# Patient Record
Sex: Male | Born: 1945 | Race: White | Hispanic: No | Marital: Single | Smoking: Current every day smoker
Health system: Southern US, Community
[De-identification: ages and names within clinical notes are randomized; demographics above are authoritative.]

## PROBLEM LIST (undated history)

## (undated) DIAGNOSIS — C801 Malignant (primary) neoplasm, unspecified: Secondary | ICD-10-CM

## (undated) DIAGNOSIS — E119 Type 2 diabetes mellitus without complications: Secondary | ICD-10-CM

## (undated) DIAGNOSIS — I10 Essential (primary) hypertension: Secondary | ICD-10-CM

## (undated) DIAGNOSIS — E039 Hypothyroidism, unspecified: Secondary | ICD-10-CM

## (undated) DIAGNOSIS — R011 Cardiac murmur, unspecified: Secondary | ICD-10-CM

## (undated) DIAGNOSIS — M1990 Unspecified osteoarthritis, unspecified site: Secondary | ICD-10-CM

## (undated) HISTORY — PX: TOTAL THYROIDECTOMY: SHX2547

## (undated) HISTORY — PX: WISDOM TOOTH EXTRACTION: SHX21

## (undated) HISTORY — PX: EYE MUSCLE SURGERY: SHX370

---

## 2002-05-28 ENCOUNTER — Ambulatory Visit (HOSPITAL_COMMUNITY): Admission: RE | Admit: 2002-05-28 | Discharge: 2002-05-28 | Payer: Self-pay | Admitting: Gastroenterology

## 2002-05-28 ENCOUNTER — Encounter (INDEPENDENT_AMBULATORY_CARE_PROVIDER_SITE_OTHER): Payer: Self-pay | Admitting: *Deleted

## 2006-11-15 ENCOUNTER — Other Ambulatory Visit: Admission: RE | Admit: 2006-11-15 | Discharge: 2006-11-15 | Payer: Self-pay | Admitting: Endocrinology

## 2007-01-21 ENCOUNTER — Encounter (INDEPENDENT_AMBULATORY_CARE_PROVIDER_SITE_OTHER): Payer: Self-pay | Admitting: *Deleted

## 2007-01-21 ENCOUNTER — Other Ambulatory Visit: Admission: RE | Admit: 2007-01-21 | Discharge: 2007-01-21 | Payer: Self-pay | Admitting: Interventional Radiology

## 2007-01-21 ENCOUNTER — Encounter: Admission: RE | Admit: 2007-01-21 | Discharge: 2007-01-21 | Payer: Self-pay | Admitting: Surgery

## 2007-10-01 ENCOUNTER — Encounter (INDEPENDENT_AMBULATORY_CARE_PROVIDER_SITE_OTHER): Payer: Self-pay | Admitting: Surgery

## 2007-10-01 ENCOUNTER — Ambulatory Visit (HOSPITAL_COMMUNITY): Admission: RE | Admit: 2007-10-01 | Discharge: 2007-10-02 | Payer: Self-pay | Admitting: Surgery

## 2008-07-13 ENCOUNTER — Encounter: Payer: Self-pay | Admitting: Internal Medicine

## 2008-08-17 ENCOUNTER — Encounter: Payer: Self-pay | Admitting: Internal Medicine

## 2008-08-18 ENCOUNTER — Encounter: Payer: Self-pay | Admitting: Internal Medicine

## 2008-08-26 ENCOUNTER — Ambulatory Visit: Payer: Self-pay | Admitting: Internal Medicine

## 2008-08-26 DIAGNOSIS — Z85850 Personal history of malignant neoplasm of thyroid: Secondary | ICD-10-CM | POA: Insufficient documentation

## 2008-08-26 DIAGNOSIS — I10 Essential (primary) hypertension: Secondary | ICD-10-CM | POA: Insufficient documentation

## 2008-08-26 DIAGNOSIS — R7309 Other abnormal glucose: Secondary | ICD-10-CM

## 2008-08-30 LAB — CONVERTED CEMR LAB: Hgb A1c MFr Bld: 6.7 % — ABNORMAL HIGH (ref 4.6–6.0)

## 2008-09-02 ENCOUNTER — Telehealth (INDEPENDENT_AMBULATORY_CARE_PROVIDER_SITE_OTHER): Payer: Self-pay | Admitting: *Deleted

## 2008-09-03 ENCOUNTER — Ambulatory Visit: Payer: Self-pay | Admitting: Internal Medicine

## 2008-09-03 ENCOUNTER — Encounter (INDEPENDENT_AMBULATORY_CARE_PROVIDER_SITE_OTHER): Payer: Self-pay | Admitting: *Deleted

## 2008-09-03 LAB — CONVERTED CEMR LAB
OCCULT 1: NEGATIVE
OCCULT 2: NEGATIVE

## 2008-09-07 ENCOUNTER — Ambulatory Visit: Payer: Self-pay | Admitting: Internal Medicine

## 2008-09-09 ENCOUNTER — Encounter (INDEPENDENT_AMBULATORY_CARE_PROVIDER_SITE_OTHER): Payer: Self-pay | Admitting: *Deleted

## 2008-09-09 LAB — CONVERTED CEMR LAB: Microalb Creat Ratio: 1.6 mg/g (ref 0.0–30.0)

## 2008-10-26 ENCOUNTER — Ambulatory Visit: Payer: Self-pay | Admitting: Internal Medicine

## 2008-10-31 LAB — CONVERTED CEMR LAB
BUN: 20 mg/dL (ref 6–23)
Creatinine, Ser: 1.1 mg/dL (ref 0.4–1.5)
Creatinine,U: 177.2 mg/dL
Hgb A1c MFr Bld: 5.9 % (ref 4.6–6.0)

## 2008-11-02 ENCOUNTER — Ambulatory Visit: Payer: Self-pay | Admitting: Internal Medicine

## 2009-06-28 ENCOUNTER — Encounter: Payer: Self-pay | Admitting: Internal Medicine

## 2010-04-21 ENCOUNTER — Telehealth (INDEPENDENT_AMBULATORY_CARE_PROVIDER_SITE_OTHER): Payer: Self-pay | Admitting: *Deleted

## 2010-04-26 ENCOUNTER — Ambulatory Visit: Payer: Self-pay | Admitting: Internal Medicine

## 2010-04-26 DIAGNOSIS — F17200 Nicotine dependence, unspecified, uncomplicated: Secondary | ICD-10-CM | POA: Insufficient documentation

## 2010-04-26 DIAGNOSIS — Z860100 Personal history of colon polyps, unspecified: Secondary | ICD-10-CM | POA: Insufficient documentation

## 2010-04-26 DIAGNOSIS — F411 Generalized anxiety disorder: Secondary | ICD-10-CM | POA: Insufficient documentation

## 2010-12-18 LAB — CONVERTED CEMR LAB
ALT: 36 units/L (ref 0–53)
AST: 22 units/L (ref 0–37)
Albumin: 4.2 g/dL (ref 3.5–5.2)
Alkaline Phosphatase: 56 units/L (ref 39–117)
BUN: 17 mg/dL (ref 6–23)
Basophils Relative: 0.5 % (ref 0.0–3.0)
Chloride: 104 meq/L (ref 96–112)
Cholesterol: 289 mg/dL — ABNORMAL HIGH (ref 0–200)
Creatinine, Ser: 1.1 mg/dL (ref 0.4–1.5)
Eosinophils Relative: 1.4 % (ref 0.0–5.0)
GFR calc non Af Amer: 71.78 mL/min (ref >60)
HCT: 44.3 % (ref 39.0–52.0)
Hemoglobin: 15.5 g/dL (ref 13.0–17.0)
MCV: 89.2 fL (ref 78.0–100.0)
Neutrophils Relative %: 65.3 % (ref 43.0–77.0)
PSA: 2.82 ng/mL (ref 0.10–4.00)
Platelets: 265 10*3/uL (ref 150.0–400.0)
RBC: 4.97 M/uL (ref 4.22–5.81)
Sodium: 142 meq/L (ref 135–145)
TSH: 5.64 microintl units/mL — ABNORMAL HIGH (ref 0.35–5.50)
Total Bilirubin: 0.8 mg/dL (ref 0.3–1.2)

## 2010-12-20 NOTE — Progress Notes (Signed)
Summary: Refill Request(APPOINTMENT OVERDUE)  Phone Note Refill Request Call back at [phone] Message from:  Pharmacy on April 21, 2010 2:41 PM  Refills Requested: Medication #1:  ALPRAZOLAM 0.25MG  TABS, take (1) tablet twice a day as needed   Dosage confirmed as above?Dosage Confirmed   Supply Requested: 1 month [facility] Patient is not longer seeing Dr. Shelva Majestic, would Dr. Alwyn Ren please ok?  Next Appointment Scheduled: none Initial call taken by: Harold Barban,  April 21, 2010 2:42 PM  Follow-up for Phone Call        Last OV 2009, please call patient and scheduled OV then we will fill. Follow-up by: Shonna Chock,  April 21, 2010 4:53 PM  Additional Follow-up for Phone Call Additional follow up Details #1::        Patient is coming in 6.7.11. Additional Follow-up by: Harold Barban,  April 22, 2010 9:32 AM

## 2010-12-20 NOTE — Assessment & Plan Note (Signed)
Summary: roa per refill//lch   Vital Signs:  Patient profile:   65 year old male Weight:      256 pounds BMI:     35.83 Temp:     98.3 degrees F oral Pulse rate:   80 / minute Resp:     17 per minute BP sitting:   130 / 84  (left arm) Cuff size:   large  Vitals Entered By: Shonna Chock (April 26, 2010 9:09 AM) CC: follow-up visit, Hypertension Management Comments REVIEWED MED LIST, PATIENT AGREED DOSE AND INSTRUCTION CORRECT    CC:  follow-up visit and Hypertension Management.  History of Present Illness: Mr. Todd Roy had been Rxed Xanax by Dr Shelva Majestic for several years; but he is not practicing in the area now. It is taken for "nerves", < 4X/month. He uses it for anger management control. No F/U of HTN, thyroid status  or DM for > 1 year. FBS not monitored. See ROS & Disease Management for these diagnoses.  Hypertension History:      He denies headache, chest pain, palpitations, dyspnea with exertion, orthopnea, PND, peripheral edema, visual symptoms, neurologic problems, syncope, and side effects from treatment.  BP not monitored.        Positive major cardiovascular risk factors include male age 56 years old or older, hypertension, and current tobacco user.     Allergies: 1)  ! Sulfa  Past History:  Past Medical History: Hypertension Thyroid papillary cancer  ; Hyperglycemia,fasting Colonic polyps, hx of, Dr Kinnie Scales  Past Surgical History: Thyroidectomy for papillary cancer  09/2007, Dr  Darnell Level; thyroid biopsies X? 6 pre thyroidectomy * EYE SURGERY age 32 Colon polypectomy, Dr Kinnie Scales,  last 2010  Family History: Family History of Alcoholism/Addiction-MGF DM-M-Aunt Father: renal cancer, MI @ 13, HTN Mother:colon cancer  Siblings: none  Social History: Occupation: Veterinary surgeon Current Smoker: 4-5 cigars/ week Alcohol use-yes Regular exercise-no  Review of Systems General:  Denies fatigue and sleep disorder. Eyes:  Denies blurring, double vision, and vision  loss-both eyes. ENT:  Denies difficulty swallowing and hoarseness. CV:  Denies leg cramps with exertion. Resp:  Denies excessive snoring, hypersomnolence, and morning headaches. GI:  Denies constipation and diarrhea. GU:  Denies discharge, dysuria, and hematuria. Derm:  Complains of rash; denies changes in nail beds, dryness, hair loss, and poor wound healing; He sees Dr Londell Moh for scalp rash. Neuro:  Denies numbness and tingling. Psych:  Complains of anxiety and irritability; denies depression, easily angered, and easily tearful. Endo:  Denies cold intolerance, excessive hunger, excessive thirst, excessive urination, and heat intolerance.  Physical Exam  General:  well-nourished; alert,appropriate and cooperative throughout examination Eyes:  No corneal or conjunctival inflammation noted. EOMI. Perrla but small. No lid lag.  Neck:  No deformities, masses, or tenderness noted. Thyroid absent Lungs:  Normal respiratory effort, chest expands symmetrically. Lungs are clear to auscultation, no crackles or wheezes. Heart:  Normal rate and regular rhythm. S1 and S2 normal without gallop,  click, rub . G 1/2 systolic murmur @ apex   Impression & Recommendations:  Problem # 1:  HYPERTENSION (ICD-401.9)  controlled His updated medication list for this problem includes:    Losartan Potassium-hctz 100-25 Mg Tabs (Losartan potassium-hctz) .Marland Kitchen... 1 by mouth once daily  Orders: Venipuncture (78469) TLB-Lipid Panel (80061-LIPID) TLB-BMP (Basic Metabolic Panel-BMET) (80048-METABOL) EKG w/ Interpretation (93000)  Problem # 2:  HYPERGLYCEMIA, FASTING (ICD-790.29)  Orders: Venipuncture (62952) TLB-A1C / Hgb A1C (Glycohemoglobin) (83036-A1C)  Problem # 3:  NEOPLASM, MALIGNANT, THYROID  GLAND, HX OF (ICD-V10.87)  Orders: Venipuncture (04540) TLB-TSH (Thyroid Stimulating Hormone) (84443-TSH)  Problem # 4:  COLONIC POLYPS, HX OF (ICD-V12.72)  as per Dr Kinnie Scales  Orders: Venipuncture  (98119) TLB-CBC Platelet - w/Differential (85025-CBCD) TLB-Hepatic/Liver Function Pnl (80076-HEPATIC)  Problem # 5:  ANXIETY STATE, UNSPECIFIED (ICD-300.00)  His updated medication list for this problem includes:    Alprazolam 0.25 Mg Tabs (Alprazolam) .Marland Kitchen... 1 by mouth as needed  Problem # 6:  SPECIAL SCREENING MALIGNANT NEOPLASM OF PROSTATE (ICD-V76.44)  Orders: Venipuncture (14782) TLB-PSA (Prostate Specific Antigen) (84153-PSA)  Problem # 7:  SMOKER (ICD-305.1) risks discussed  Complete Medication List: 1)  Synthroid 175 Mcg Tabs (Levothyroxine sodium) .Marland Kitchen.. 1 by mouth once daily 2)  Losartan Potassium-hctz 100-25 Mg Tabs (Losartan potassium-hctz) .Marland Kitchen.. 1 by mouth once daily 3)  Freestyle Lite Test Strp (Glucose blood) .... As directed 4)  Freestyle Unistick Ii Lancets Misc (Lancets) .... As directed 5)  Alprazolam 0.25 Mg Tabs (Alprazolam) .Marland Kitchen.. 1 by mouth as needed 6)  Doxycycline Hyclate 50 Mg Caps (Doxycycline hyclate) .... 3 caps two times a day  Hypertension Assessment/Plan:      The patient's hypertensive risk group is category B: At least one risk factor (excluding diabetes) with no target organ damage.  Today's blood pressure is 130/84.    Patient Instructions: 1)  Stop Smoking Tips: Choose a Quit date. Cut down before the Quit date. decide what you will do as a substitute when you feel the urge to smoke(gum,toothpick,exercise).It is important that you exercise regularly at least 20 minutes 5 times a week. If you develop chest pain, have severe difficulty breathing, or feel very tired , stop exercising immediately and seek medical attention.Take an  81 mg coated  Aspirin every day. 2)  Check your Blood Pressure regularly. If it is above: you should make an appointment. Prescriptions: ALPRAZOLAM 0.25 MG TABS (ALPRAZOLAM) 1 by mouth as needed  #30 x 5   Entered and Authorized by:   Marga Melnick MD   Signed by:   Marga Melnick MD on 04/26/2010   Method used:   Print then  Give to Patient   RxID:   [id number] LOSARTAN POTASSIUM-HCTZ 100-25 MG TABS (LOSARTAN POTASSIUM-HCTZ) 1 by mouth once daily  #90 x 3   Entered and Authorized by:   Marga Melnick MD   Signed by:   Marga Melnick MD on 04/26/2010   Method used:   Print then Give to Patient   RxID:   [id number] SYNTHROID 175 MCG TABS (LEVOTHYROXINE SODIUM) 1 by mouth once daily  #90 x 3   Entered and Authorized by:   Marga Melnick MD   Signed by:   Marga Melnick MD on 04/26/2010   Method used:   Print then Give to Patient   RxID:   [id number]

## 2010-12-29 ENCOUNTER — Emergency Department (HOSPITAL_COMMUNITY)
Admission: EM | Admit: 2010-12-29 | Discharge: 2010-12-29 | Disposition: A | Discharge status: Home / Self Care | Payer: BC Managed Care – PPO | Attending: Emergency Medicine | Admitting: Emergency Medicine

## 2010-12-29 ENCOUNTER — Emergency Department (HOSPITAL_COMMUNITY): Payer: BC Managed Care – PPO

## 2010-12-29 DIAGNOSIS — I10 Essential (primary) hypertension: Secondary | ICD-10-CM | POA: Insufficient documentation

## 2010-12-29 DIAGNOSIS — B9789 Other viral agents as the cause of diseases classified elsewhere: Secondary | ICD-10-CM | POA: Insufficient documentation

## 2010-12-29 DIAGNOSIS — R05 Cough: Secondary | ICD-10-CM | POA: Insufficient documentation

## 2010-12-29 DIAGNOSIS — E039 Hypothyroidism, unspecified: Secondary | ICD-10-CM | POA: Insufficient documentation

## 2010-12-29 DIAGNOSIS — R509 Fever, unspecified: Secondary | ICD-10-CM | POA: Insufficient documentation

## 2010-12-29 DIAGNOSIS — R059 Cough, unspecified: Secondary | ICD-10-CM | POA: Insufficient documentation

## 2010-12-29 DIAGNOSIS — R000 Tachycardia, unspecified: Secondary | ICD-10-CM | POA: Insufficient documentation

## 2010-12-29 DIAGNOSIS — IMO0001 Reserved for inherently not codable concepts without codable children: Secondary | ICD-10-CM | POA: Insufficient documentation

## 2010-12-29 LAB — POCT I-STAT, CHEM 8
Calcium, Ion: 1.09 mmol/L — ABNORMAL LOW (ref 1.12–1.32)
Creatinine, Ser: 1.5 mg/dL (ref 0.4–1.5)
Glucose, Bld: 144 mg/dL — ABNORMAL HIGH (ref 70–99)
HCT: 44 % (ref 39.0–52.0)
TCO2: 23 mmol/L (ref 0–100)

## 2010-12-29 LAB — CBC
HCT: 41.1 % (ref 39.0–52.0)
Hemoglobin: 14.9 g/dL (ref 13.0–17.0)
RBC: 4.84 MIL/uL (ref 4.22–5.81)
RDW: 13.3 % (ref 11.5–15.5)

## 2010-12-29 LAB — DIFFERENTIAL
Eosinophils Absolute: 0 10*3/uL (ref 0.0–0.7)
Eosinophils Relative: 0 % (ref 0–5)
Lymphocytes Relative: 11 % — ABNORMAL LOW (ref 12–46)
Lymphs Abs: 1 10*3/uL (ref 0.7–4.0)
Monocytes Absolute: 0.9 10*3/uL (ref 0.1–1.0)
Monocytes Relative: 10 % (ref 3–12)

## 2010-12-29 LAB — URINALYSIS, ROUTINE W REFLEX MICROSCOPIC
Bilirubin Urine: NEGATIVE
Hgb urine dipstick: NEGATIVE
Specific Gravity, Urine: 1.024 (ref 1.005–1.030)
Urobilinogen, UA: 0.2 mg/dL (ref 0.0–1.0)
pH: 5.5 (ref 5.0–8.0)

## 2011-01-03 ENCOUNTER — Encounter: Payer: Self-pay | Admitting: Internal Medicine

## 2011-01-10 ENCOUNTER — Encounter: Payer: Self-pay | Admitting: Internal Medicine

## 2011-01-26 NOTE — Letter (Signed)
Summary: [facility]  [facility]   Imported By: Maryln Gottron 01/17/2011 12:47:25  _____________________________________________________________________  External Attachment:    Type:   Image     Comment:   External Document

## 2011-01-31 NOTE — Letter (Signed)
Summary: [facility]  [facility]   Imported By: Maryln Gottron 01/24/2011 15:08:32  _____________________________________________________________________  External Attachment:    Type:   Image     Comment:   External Document

## 2011-04-04 NOTE — Op Note (Signed)
NAMEATARI, NOVICK NO.:  [id number]   MEDICAL RECORD NO.:  [id number]          PATIENT TYPE:  AMB   LOCATION:  DAY                          FACILITY:  [facility]   PHYSICIAN:  Velora Heckler, MD      DATE OF BIRTH:  Nov 22, 1945   DATE OF PROCEDURE:  10/01/2007  DATE OF DISCHARGE:                               OPERATIVE REPORT   PREOPERATIVE DIAGNOSIS:  Bilateral thyroid nodules with cytologic  atypia.   POSTOPERATIVE DIAGNOSIS:  Same.   PROCEDURE:  Total thyroidectomy.   SURGEON:  Velora Heckler, MD, FACS   ASSISTANT:  Anselm Pancoast. Zachery Dakins, MD, FACS   ANESTHESIA:  General per Dr. Valli Glance. Denenny   ESTIMATED BLOOD LOSS:  Minimal.   PREPARATION:  Betadine.   COMPLICATIONS:  None.   INDICATIONS:  The patient is a 65 year old white male from [place],  [place].  Incidental finding of thyroid nodules was made by MRI  scan in 2007.  Subsequent thyroid ultrasound was performed showing  bilateral thyroid nodules.  However, the left-sided thyroid nodule  contained punctate calcifications.  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  The patient now comes to surgery for a total thyroidectomy  for bilateral thyroid nodules with cytologic atypia.   BODY OF REPORT:  Procedure was done in OR #6 at the [facility].  The patient was brought to the operating room,  placed in a supine position on the operating room table.  Following  administration of general anesthesia, the patient was prepped and draped  in usual strict aseptic fashion.  After ascertaining that an adequate  level of anesthesia had been obtained, a Kocher incision was made with a  #15 blade.  Dissection was carried through subcutaneous tissues.  Platysma was divided with the electrocautery.  Subplatysmal flaps were  elevated  cephalad and caudad from the thyroid notch to the sternal  notch.  A Mahorner self-retaining retractor was placed for exposure.  Strap muscles were incised in the midline.  Dissection was carried down  to the thyroid isthmus.  Strap muscles were reflected initially to the  left.  Left thyroid lobe was exposed.  There was a dominant mass in the  superior pole on the left side which measured approximately 2 to 2.5 cm  in size.  It was quite firm.  It was likely calcified.  Venous  tributaries were divided between medium hemoclips with the Harmonic  scalpel.  Inferior venous tributaries were divided with the Harmonic  scalpel.  Superior pole vessels were dissected out, ligated in  continuity with 2-0 silk ties and medium Ligaclips, and divided with the  Harmonic scalpel.  Gland was rolled anteriorly.  Parathyroid tissue was  identified and preserved.  Dissection was carried down to the ligament  of Berry.  Branches of the inferior thyroid artery were divided between  small Ligaclips with the Harmonic scalpel.  Ligament of Allyson Sabal was  transected with the electrocautery, and the gland was rolled up  and onto  the anterior trachea.  The isthmus was mobilized across the midline with  the electrocautery.   Next we turned our attention to the right thyroid lobe.  Right thyroid  lobe was approximately two to three times larger than the left.  However, it was soft without dominant or discrete mass.  There were  multiple small soft nodules in the right lobe.  Middle vein was divided  between medium Ligaclips with the Harmonic scalpel.  Superior pole  vessels were dissected out, ligated with medium hemoclips, and divided  with the Harmonic scalpel.  Gland was rolled anteriorly.  Parathyroid  tissue was identified and preserved.  Branches of the inferior thyroid  artery were divided between small and medium Ligaclips with the Harmonic  scalpel.  Gland was rolled further anteriorly.  Ligament of Allyson Sabal  was  transected with the electrocautery.  Gland was then excised off the  anterior trachea using the Harmonic scalpel.  A suture was used to mark  the left superior pole.  The entire gland was submitted to Pathology for  review.  The neck was irrigated copiously with warm saline.  Very good  hemostasis was noted bilaterally.  Surgicel was placed in the operative  field bilaterally.  Strap muscles were reapproximated in the midline  with interrupted 3-0 Vicryl sutures.  Platysma was closed with  interrupted 3-0 Vicryl sutures.  Skin was closed with a running 4-0  Monocryl subcuticular suture.  Wound was washed and dried, and Benzoin  and Steri-Strips were applied.  Sterile dressings were applied.  The  patient was awakened from anesthesia and brought to the recovery room in  stable condition.  The patient tolerated the procedure well.      Velora Heckler, MD  Electronically Signed     TMG/MEDQ  D:  10/01/2007  T:  10/02/2007  Job:  [id number]   cc:   Macarthur Critchley. Shelva Majestic, M.D.  Fax: [phone]   Titus Dubin. Alwyn Ren, MD,FACP,FCCP  [phone] [place]  [place] [address]   [facility] [place]. Venetia Maxon, M.D.  Fax: [phone]

## 2011-04-07 NOTE — Procedures (Signed)
[place]  Patient:    MARCELLO, TUZZOLINO Visit Number: [id number] MRN: [id number]          Service Type: END Location: ENDO Attending Physician:  Charna Elizabeth Dictated by:   Anselmo Rod, M.D. Proc. Date: 05/28/02 Admit Date:  05/28/2002 Discharge Date: 05/28/2002   CC:         Louanna Raw, M.D.   Procedure Report  DATE OF BIRTH:  1946-03-07.  PROCEDURE:  Colonoscopy with snare polypectomy x2.  ENDOSCOPIST:  Anselmo Rod, M.D.  INSTRUMENT USED:  Olympus video colonoscope.  INDICATION FOR PROCEDURE:  A 65 year old white male with a family history of colon cancer in his mother diagnosed in her 59s and a personal history of rectal bleeding.  Rule out colonic polyps, masses, hemorrhoids, etc.  PREPROCEDURE PREPARATION:  Informed consent was procured from the patient. The patient was fasted for eight hours prior to the procedure and prepped with a bottle of magnesium citrate and a gallon of NuLytely the night prior to the procedure.  PREPROCEDURE PHYSICAL:  VITAL SIGNS:  The patient had stable vital signs.  NECK:  Supple.  CHEST:  Clear to auscultation.  S1, S2 regular.  ABDOMEN:  Soft with normal bowel sounds.  DESCRIPTION OF PROCEDURE:  The patient was placed in the left lateral decubitus position and sedated with 100 mg of Demerol and 8.5 mg of Versed intravenously.  Once the patient was adequately sedate and maintained on low-flow oxygen and continuous cardiac monitoring, the Olympus video colonoscope was advanced from the rectum to the cecum with slight difficulty. The patient had significant amount of residual stool in the colon.  Multiple washes were done.  The procedure was complete up to the cecum.  The appendiceal orifice and the ileocecal valve were clearly visualized and photographed.  Very small lesions could have been missed secondary to an inappropriate prep.  A small sessile polyp was snared from 70 cm.   Another pedunculated polyp was snared from 20 cm.  There were small internal hemorrhoids seen on retroflexion.  The patient tolerated the procedure well without complication.  IMPRESSION: 1. Small, nonbleeding internal hemorrhoids. 2. Two polyps snared from the left colon, one at 20 cm and one at 70 cm, see    description above. 3. Large amount of residual stool in the colon.  Very small lesions could have    been missed.  RECOMMENDATIONS: 1. Await pathology results. 2. Avoid all nonsteroidals, including aspirin, for the next four weeks. 3. Outpatient follow-up in the next 10 days for further recommendations. Dictated by:   Anselmo Rod, M.D. Attending Physician:  Charna Elizabeth DD:  05/28/02 TD:  06/01/02 Job: [id number]

## 2011-05-20 ENCOUNTER — Other Ambulatory Visit: Payer: Self-pay | Admitting: Internal Medicine

## 2011-05-22 NOTE — Telephone Encounter (Signed)
(   Fasting lipids, hepatic panel, A1c, TSH in 10 weeks; 272.4,250.00,995.20, 244.9)  Copied from 04/2010. Labs need to be scheduled NOW and CPX needs to be scheduled for Next avaliable

## 2011-06-19 ENCOUNTER — Other Ambulatory Visit (INDEPENDENT_AMBULATORY_CARE_PROVIDER_SITE_OTHER): Payer: BC Managed Care – PPO

## 2011-06-19 DIAGNOSIS — E039 Hypothyroidism, unspecified: Secondary | ICD-10-CM

## 2011-06-19 DIAGNOSIS — E785 Hyperlipidemia, unspecified: Secondary | ICD-10-CM

## 2011-06-19 DIAGNOSIS — T887XXA Unspecified adverse effect of drug or medicament, initial encounter: Secondary | ICD-10-CM

## 2011-06-19 DIAGNOSIS — E119 Type 2 diabetes mellitus without complications: Secondary | ICD-10-CM

## 2011-06-19 LAB — HEPATIC FUNCTION PANEL
AST: 28 U/L (ref 0–37)
Albumin: 4.3 g/dL (ref 3.5–5.2)
Alkaline Phosphatase: 54 U/L (ref 39–117)
Bilirubin, Direct: 0.1 mg/dL (ref 0.0–0.3)

## 2011-06-19 LAB — HEMOGLOBIN A1C: Hgb A1c MFr Bld: 7.1 % — ABNORMAL HIGH (ref 4.6–6.5)

## 2011-06-19 LAB — LIPID PANEL: HDL: 50.1 mg/dL (ref >39.00)

## 2011-06-19 NOTE — Progress Notes (Signed)
Labs only

## 2011-06-29 ENCOUNTER — Other Ambulatory Visit: Payer: Self-pay | Admitting: Internal Medicine

## 2011-06-29 MED ORDER — SYNTHROID 175 MCG PO TABS: 175.0000 ug | ORAL_TABLET | ORAL | Status: DC

## 2011-06-29 MED ORDER — LOSARTAN POTASSIUM-HCTZ 100-25 MG PO TABS: 1.0000 | ORAL_TABLET | Freq: Every day | ORAL | Status: DC

## 2011-06-29 NOTE — Telephone Encounter (Signed)
Patient needs to schedule CPX and Labs: Lipid/Hep/TSH/PSA/CBCD/BMP/Stool Cards/Udip V70.0/401.9/272.4/994.20/

## 2011-07-26 ENCOUNTER — Other Ambulatory Visit: Payer: Self-pay | Admitting: Internal Medicine

## 2011-07-26 MED ORDER — SYNTHROID 175 MCG PO TABS: 175.0000 ug | ORAL_TABLET | ORAL | Status: DC

## 2011-07-26 MED ORDER — LOSARTAN POTASSIUM-HCTZ 100-25 MG PO TABS: 1.0000 | ORAL_TABLET | Freq: Every day | ORAL | Status: DC

## 2011-07-26 NOTE — Telephone Encounter (Signed)
RXs sent in 

## 2011-07-28 ENCOUNTER — Ambulatory Visit (INDEPENDENT_AMBULATORY_CARE_PROVIDER_SITE_OTHER): Payer: BC Managed Care – PPO | Admitting: Internal Medicine

## 2011-07-28 ENCOUNTER — Encounter: Payer: Self-pay | Admitting: Internal Medicine

## 2011-07-28 DIAGNOSIS — R7309 Other abnormal glucose: Secondary | ICD-10-CM

## 2011-07-28 DIAGNOSIS — Z85850 Personal history of malignant neoplasm of thyroid: Secondary | ICD-10-CM

## 2011-07-28 DIAGNOSIS — Z86010 Personal history of colonic polyps: Secondary | ICD-10-CM

## 2011-07-28 DIAGNOSIS — E785 Hyperlipidemia, unspecified: Secondary | ICD-10-CM

## 2011-07-28 DIAGNOSIS — I10 Essential (primary) hypertension: Secondary | ICD-10-CM

## 2011-07-28 MED ORDER — METFORMIN HCL ER 500 MG PO TB24: 500.0000 mg | ORAL_TABLET | Freq: Every day | ORAL | Status: DC

## 2011-07-28 MED ORDER — SYNTHROID 175 MCG PO TABS: 175.0000 ug | ORAL_TABLET | ORAL | Status: DC

## 2011-07-28 MED ORDER — PRAVASTATIN SODIUM 20 MG PO TABS: 20.0000 mg | ORAL_TABLET | Freq: Every evening | ORAL | Status: DC

## 2011-07-28 NOTE — Progress Notes (Signed)
  Subjective:    Patient ID: Todd Roy, male    DOB: 27-Jun-1946, 65 y.o.   MRN: [id number]  HPI Thyroid function monitor : TSH 0.89 Medications status(change in dose/brand/mode of administration): no Constitutional: Weight change: dropping with diet changes; Fatigue:no; Sleep pattern:chronic; Appetite:good  Visual change(blurred/diplopia/visual loss):no; last exam 4 mos ago Hoarseness:no; Swallowing issues:no Cardiovascular: Palpitations:no; Racing:no; Irregularity:no GI: Constipation:no; Diarrhea:no Derm: Change in nails/hair/skin:no Neuro: Numbness/tingling:no; Tremor:occasionally RUE as intention tremor Psych: Anxiety:no; Depression:no; Panic attacks:no Endo: Temperature intolerance: Heat:no; Cold:no   HYPERTENSION: Disease Monitoring: Blood pressure range-stable, normal  Dyspnea- no Medications: Compliance- yes  Lightheadedness,Syncope- no    Edema- no  DIABETES: A1c 7.1% Disease Monitoring: Blood Sugar ranges-FBS ?  Polyuria/phagia/dipsia- no       Medications: Compliance- not on DM meds  Hypoglycemic symptoms- no   HYPERLIPIDEMIA: TG 176, LDL 181.8 Disease Monitoring: See symptoms for Hypertension Medications: Compliance- not on meds     ROS See HPI above   PMH Smoking Status noted : 2-3 cigarettes / weekend     Review of Systems     Objective:   Physical Exam Gen.: Healthy and well-nourished in appearance. Alert, appropriate and cooperative throughout exam.  Eyes: No corneal or conjunctival inflammation noted.  Extraocular motion intact.   Neck: No deformities, masses, or tenderness noted.  Thyroid absent. Lungs: Normal respiratory effort; chest expands symmetrically. Lungs are clear to auscultation without rales, wheezes, or increased work of breathing. Heart: Normal rate and rhythm. Normal S1 and S2. No gallop, click, or rub. S4 w/o  murmur.                                                                           Musculoskeletal/extremities:  No clubbing, cyanosis, edema, or deformity noted. Joints normal. Nail health  good. Vascular: Carotid, radial artery, dorsalis pedis and  posterior tibial pulses are full and equal. No bruits present. Neurologic: Alert and oriented x3. Deep tendon reflexes symmetrical and normal.   Light touch normal over feet       Skin: Intact without suspicious lesions or rashes. Lymph: No cervical, axillary  lymphadenopathy present. Psych: Mood and affect are normal. Normally interactive                                                                                         Assessment & Plan:  #1 hypothyroidism, status post total thyroidectomy for malignancy. Optimal TSH would be at least lower limits of normal.  #2 diabetes, poor control. 42% increased long-term cardiovascular risk  #3 hypertension, excellent control  #4 dyslipidemia; LDL values associated with an 80% increased long-term cardiovascular risk  Plan see orders and recommendations.

## 2011-07-28 NOTE — Patient Instructions (Signed)
Eat a low-fat diet with lots of fruits and vegetables, up to 7-9 servings per day. Avoid obesity; your goal is waist measurement < 40 inches.Consume less than 40 grams of sugar per day from foods & drinks with High Fructose Corn Sugar as #1,2,3 or # 4 on label. Follow the low carb nutrition program in [employer] as closely as possible to prevent Diabetes progression & complications. White carbohydrates (potatoes, rice, bread, and pasta) have a high spike of sugar and a high load of sugar. For example a  baked potato has a cup of sugar and a  french fry  2 teaspoons of sugar. Yams, wild  rice, whole grained bread &  wheat pasta have been much lower spike and load of  sugar. Portions should be the size of a deck of cards or your palm. A1c NORMAL VALUES:  Non diabetic adults: 5 %-6.1%  Good diabetic control: 6.2-6.4 %  Fair diabetic control: 6.5-7%  Poor diabetic control: greater than 7 % ( except with additional factors such as  advanced age; significant coronary or neurologic disease,etc). Check the A1c every 6 months if it is < 6.5%; every 4 months if  6.5% or higher. Goals for home glucose monitoring are : fasting  or morning glucose goal of  90-150. Two hours after any meal , goal = < 180, preferably < 150. Please  schedule fasting Labs in 10 weeks : BMET,Lipids, hepatic panel,  TSH ( 250.02, 272.4)

## 2011-08-29 LAB — DIFFERENTIAL
Basophils Absolute: 0
Basophils Relative: 1
Monocytes Absolute: 0.4
Neutro Abs: 3.9
Neutrophils Relative %: 63

## 2011-08-29 LAB — BASIC METABOLIC PANEL
BUN: 13
Calcium: 9
Chloride: 101
Creatinine, Ser: 1.09

## 2011-08-29 LAB — CBC
HCT: 42.3
Hemoglobin: 15.2
MCHC: 36
MCV: 84.5
Platelets: 265
RBC: 5.01
RDW: 13.6
WBC: 6.1

## 2011-08-29 LAB — PROTIME-INR: INR: 1

## 2011-08-29 LAB — CALCIUM: Calcium: 8.8

## 2011-09-02 ENCOUNTER — Other Ambulatory Visit: Payer: Self-pay | Admitting: Internal Medicine

## 2011-09-26 ENCOUNTER — Other Ambulatory Visit: Payer: Self-pay | Admitting: Dermatology

## 2011-10-06 ENCOUNTER — Other Ambulatory Visit (HOSPITAL_COMMUNITY): Payer: Self-pay | Admitting: Internal Medicine

## 2011-10-06 ENCOUNTER — Other Ambulatory Visit: Payer: BC Managed Care – PPO

## 2011-10-06 DIAGNOSIS — I428 Other cardiomyopathies: Secondary | ICD-10-CM

## 2011-10-10 ENCOUNTER — Telehealth: Payer: Self-pay

## 2011-10-10 NOTE — Telephone Encounter (Signed)
Opened in error

## 2011-10-13 ENCOUNTER — Ambulatory Visit (HOSPITAL_COMMUNITY): Payer: BC Managed Care – PPO | Attending: Cardiology | Admitting: Radiology

## 2011-10-13 DIAGNOSIS — I359 Nonrheumatic aortic valve disorder, unspecified: Secondary | ICD-10-CM | POA: Insufficient documentation

## 2011-10-13 DIAGNOSIS — I428 Other cardiomyopathies: Secondary | ICD-10-CM | POA: Insufficient documentation

## 2011-10-13 DIAGNOSIS — E119 Type 2 diabetes mellitus without complications: Secondary | ICD-10-CM | POA: Insufficient documentation

## 2011-10-13 DIAGNOSIS — I059 Rheumatic mitral valve disease, unspecified: Secondary | ICD-10-CM | POA: Insufficient documentation

## 2011-10-13 DIAGNOSIS — I379 Nonrheumatic pulmonary valve disorder, unspecified: Secondary | ICD-10-CM | POA: Insufficient documentation

## 2011-10-13 DIAGNOSIS — I10 Essential (primary) hypertension: Secondary | ICD-10-CM | POA: Insufficient documentation

## 2011-10-13 DIAGNOSIS — E785 Hyperlipidemia, unspecified: Secondary | ICD-10-CM | POA: Insufficient documentation

## 2012-02-19 ENCOUNTER — Other Ambulatory Visit: Payer: Self-pay | Admitting: Internal Medicine

## 2012-02-19 NOTE — Telephone Encounter (Signed)
Refill done.  

## 2012-03-22 ENCOUNTER — Other Ambulatory Visit: Payer: Self-pay | Admitting: Internal Medicine

## 2012-03-26 ENCOUNTER — Other Ambulatory Visit: Payer: Self-pay | Admitting: Internal Medicine

## 2012-03-26 NOTE — Telephone Encounter (Signed)
OK X1 

## 2012-03-26 NOTE — Telephone Encounter (Signed)
RX sent

## 2012-03-26 NOTE — Telephone Encounter (Signed)
Dr.Hopper please advise, last filled 2011. Last OV 07/2011

## 2012-06-08 IMAGING — CR DG CHEST 2V
2 series · 2 of 2 positions shown · non-contrast
Comparison: 10/01/2007.

CLINICAL DATA: Fever, cough, headache and congestion.

CHEST - 2 VIEW

[w chest pa]
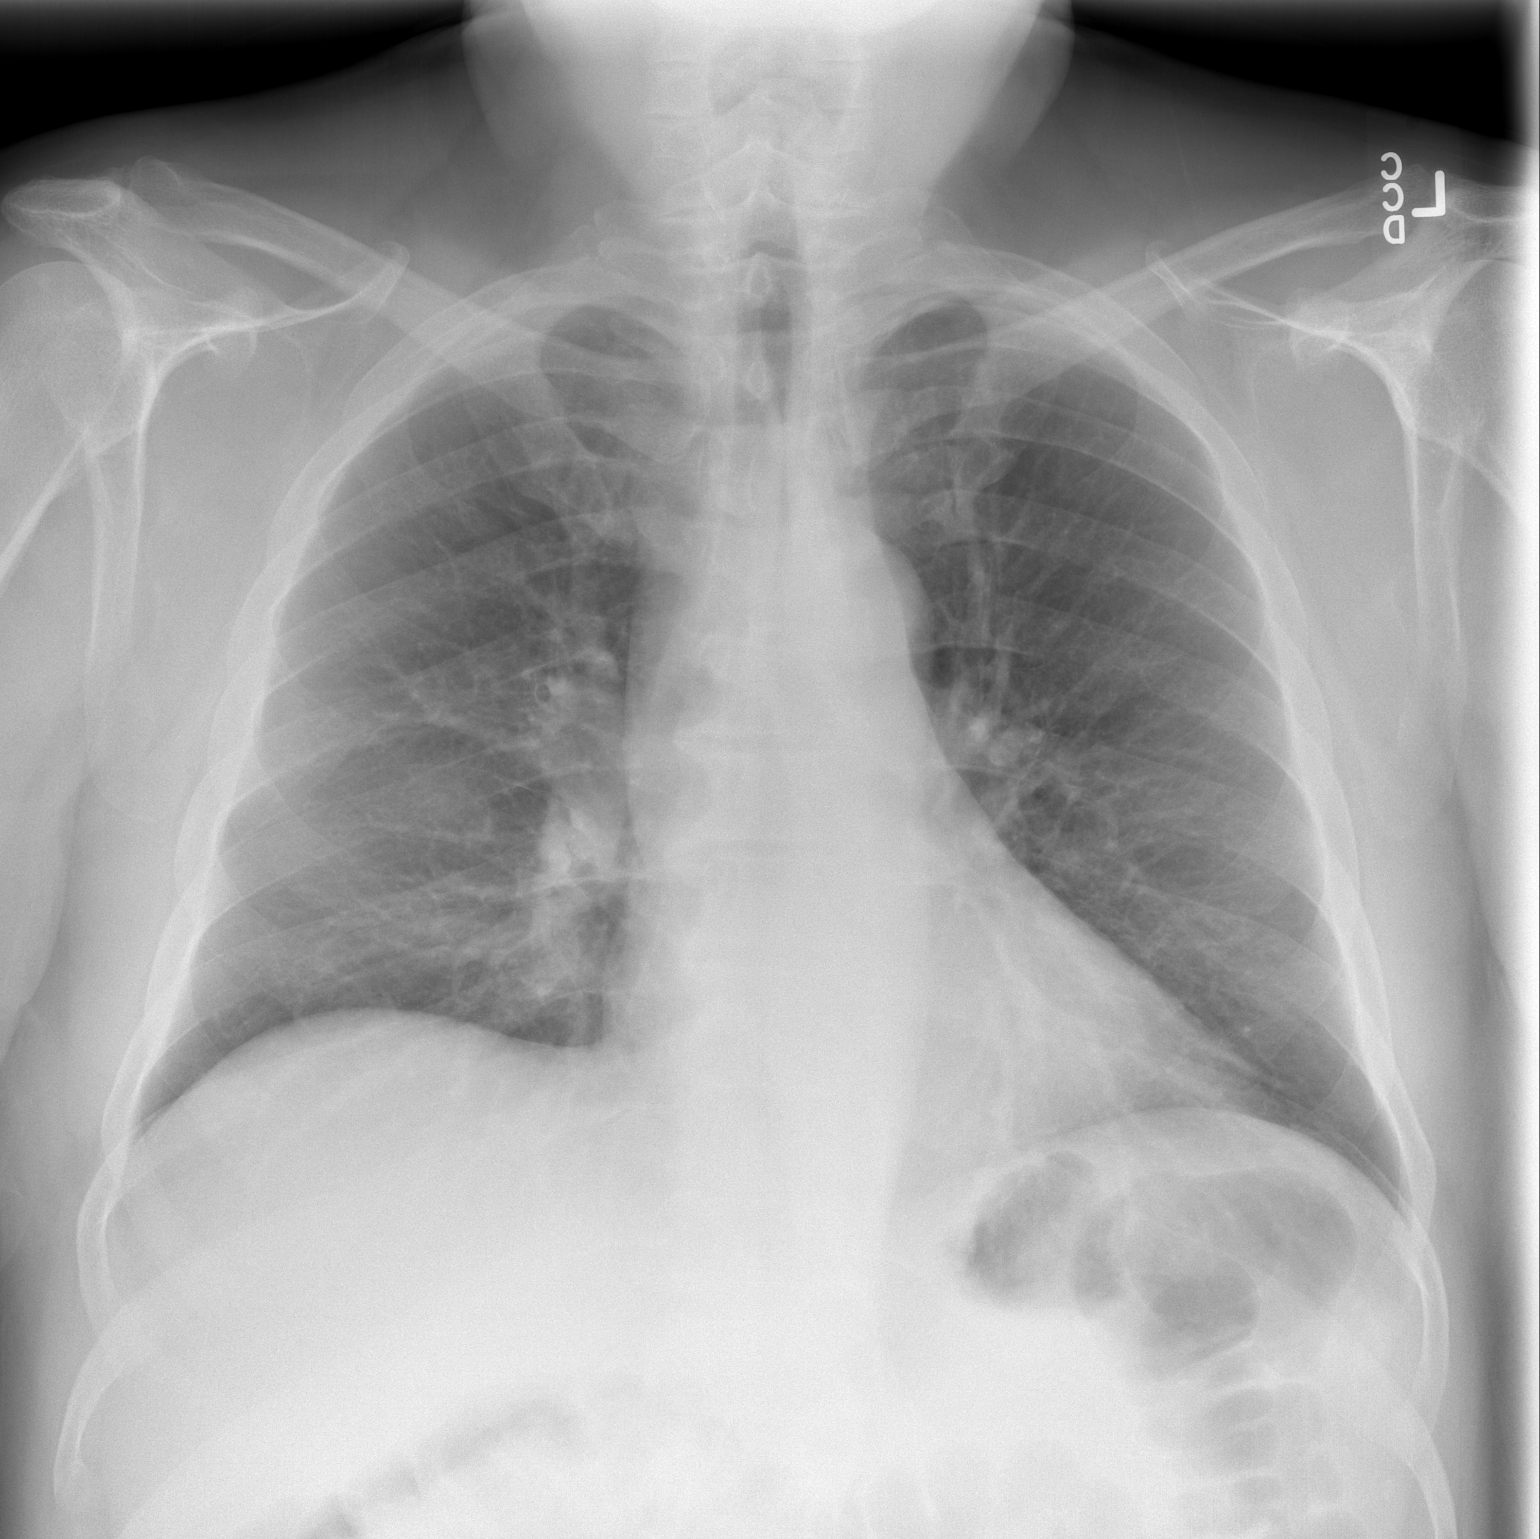

[w chest lat]
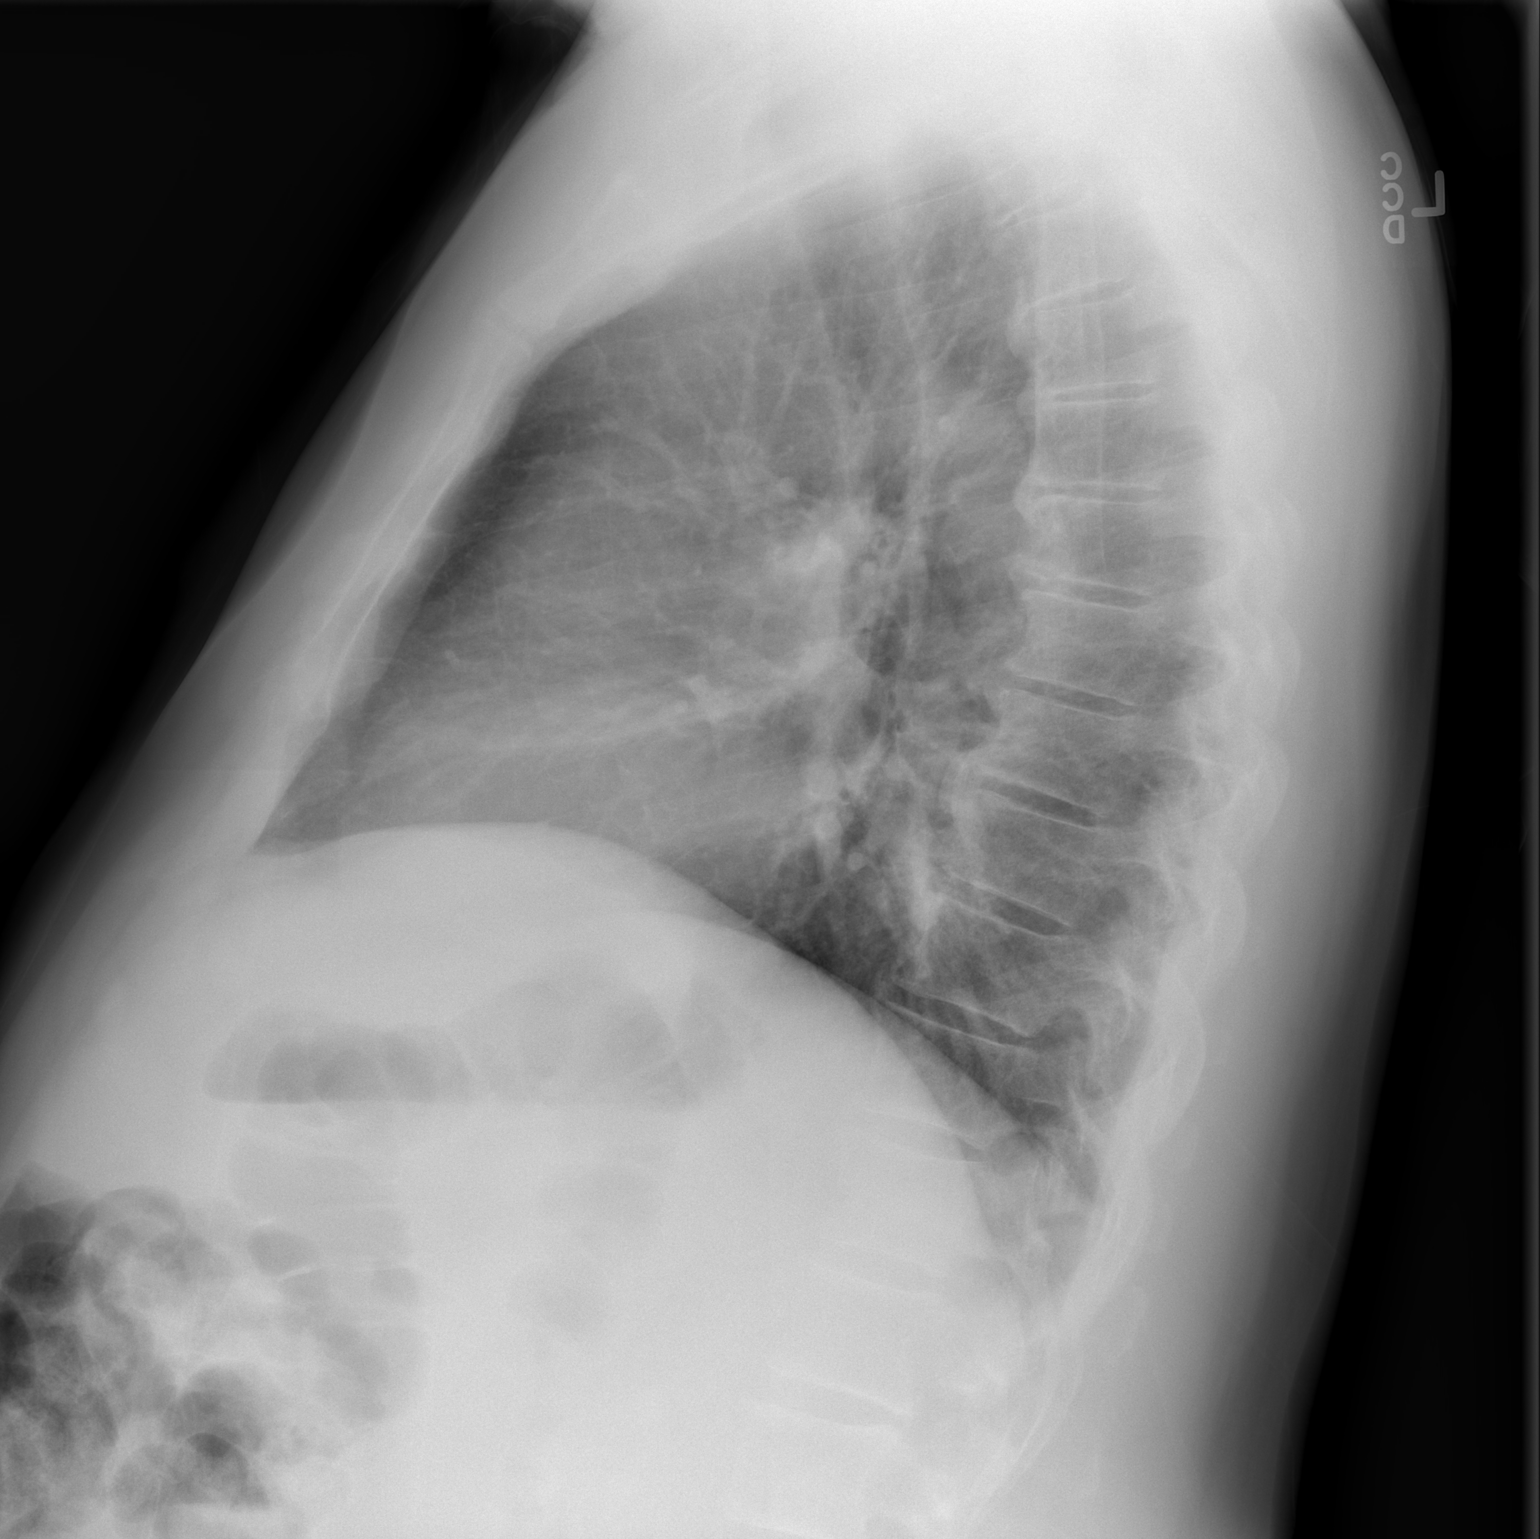

[2 of 2 positions shown; findings below may reference images not displayed]

FINDINGS: The heart size and mediastinal contours are stable.  The
lungs appear clear.  There is no pleural effusion or pneumothorax.
No acute osseous findings are identified.
IMPRESSION: Stable examination.  No evidence of pneumonia.

## 2012-07-24 ENCOUNTER — Other Ambulatory Visit: Payer: Self-pay | Admitting: Internal Medicine

## 2012-12-20 ENCOUNTER — Other Ambulatory Visit: Payer: Self-pay | Admitting: Emergency Medicine

## 2020-01-04 ENCOUNTER — Ambulatory Visit: Payer: Medicare Other | Attending: Internal Medicine

## 2020-01-04 DIAGNOSIS — Z23 Encounter for immunization: Secondary | ICD-10-CM | POA: Insufficient documentation

## 2020-01-04 NOTE — Progress Notes (Signed)
   Covid-19 Vaccination Clinic  Name:  Todd Roy    MRN: [id number] DOB: 1946/06/07  01/04/2020  Mr. Todd Roy was observed post Covid-19 immunization for 15 minutes without incidence. He was provided with Vaccine Information Sheet and instruction to access the V-Safe system.   Mr. Todd Roy was instructed to call 911 with any severe reactions post vaccine: Marland Kitchen Difficulty breathing  . Swelling of your face and throat  . A fast heartbeat  . A bad rash all over your body  . Dizziness and weakness    Immunizations Administered    Name Date Dose VIS Date Route   Pfizer COVID-19 Vaccine 01/04/2020 12:27 PM 0.3 mL 10/31/2019 Intramuscular   Manufacturer: [place]   Lot: [id number]   [place]: [id number]

## 2020-01-27 ENCOUNTER — Ambulatory Visit: Payer: Medicare Other | Attending: Internal Medicine

## 2020-01-27 DIAGNOSIS — Z23 Encounter for immunization: Secondary | ICD-10-CM | POA: Insufficient documentation

## 2020-01-27 NOTE — Progress Notes (Signed)
   Covid-19 Vaccination Clinic  Name:  Todd Roy    MRN: [id number] DOB: 1945/11/22  01/27/2020  [name] was observed post Covid-19 immunization for 15 minutes without incident. He was provided with Vaccine Information Sheet and instruction to access the V-Safe system.   [name] was instructed to call 911 with any severe reactions post vaccine: Marland Kitchen Difficulty breathing  . Swelling of face and throat  . A fast heartbeat  . A bad rash all over body  . Dizziness and weakness   Immunizations Administered    Name Date Dose VIS Date Route   Pfizer COVID-19 Vaccine 01/27/2020  5:23 PM 0.3 mL 10/31/2019 Intramuscular   Manufacturer: [place]   Lot: [id number]   [place]: [id number]

## 2022-05-02 NOTE — H&P (Signed)
TOTAL KNEE ADMISSION H&P  Patient is being admitted for left total knee arthroplasty.  Subjective:  Chief Complaint: Left knee pain.  HPI: Todd Roy, 76 y.o. male has a history of pain and functional disability in the left knee due to arthritis and has failed non-surgical conservative treatments for greater than 12 weeks to include NSAID's and/or analgesics, corticosteriod injections, and activity modification. Onset of symptoms was gradual, starting several years ago with gradually worsening course since that time. The patient noted no past surgery on the left knee.  Patient currently rates pain in the left knee at 7 out of 10 with activity. Patient has night pain, worsening of pain with activity and weight bearing, and pain that interferes with activities of daily living. Patient has evidence of  bone-on-bone arthritis in the medial and patellofemoral compartments of the bilateral knees  by imaging studies. There is no active infection.  Patient Active Problem List   Diagnosis Date Noted   ANXIETY STATE, UNSPECIFIED 04/26/2010   SMOKER 04/26/2010   COLONIC POLYPS, HX OF 04/26/2010   HYPERTENSION 08/26/2008   HYPERGLYCEMIA, FASTING 08/26/2008   NEOPLASM, MALIGNANT, THYROID GLAND, HX OF 08/26/2008    No past medical history on file.  Past Surgical History:  Procedure Laterality Date   EYE MUSCLE SURGERY  3   "cross eyed"; OS esotropia   TOTAL THYROIDECTOMY     Dr Harlow Asa   WISDOM TOOTH EXTRACTION      Prior to Admission medications   Medication Sig Start Date End Date Taking? Authorizing Provider  HYZAAR 100-25 MG per tablet TAKE 1 TABLET ONCE DAILY. 09/02/11   Hendricks Limes, MD  metFORMIN (GLUCOPHAGE XR) 500 MG 24 hr tablet Take 1 tablet (500 mg total) by mouth daily with breakfast. 07/28/11 07/27/12  Hendricks Limes, MD  pravastatin (PRAVACHOL) 20 MG tablet Take 1 tablet (20 mg total) by mouth every evening. 07/28/11 07/27/12  Hendricks Limes, MD  SYNTHROID 175 MCG tablet  TAKE 1 TABLET DAILY AS DIRECTED EXCEPT TAKE 1&1/2 TABLETS ON TUES/THURS. 02/19/12   Hendricks Limes, MD  Duanne Moron 0.25 MG tablet TAKE (1) TABLET AS NEEDED. 07/24/12   Hendricks Limes, MD    Allergies  Allergen Reactions   Sulfonamide Derivatives     Reaction as child    Social History   Socioeconomic History   Marital status: Single    Spouse name: Not on file   Number of children: Not on file   Years of education: Not on file   Highest education level: Not on file  Occupational History   Not on file  Tobacco Use   Smoking status: Some Days    Types: Cigars   Smokeless tobacco: Not on file   Tobacco comments:    2-3 cigars/ weekend  Substance and Sexual Activity   Alcohol use: Yes    Alcohol/week: 7.0 standard drinks of alcohol    Types: 7 Shots of liquor per week   Drug use: No   Sexual activity: Not on file  Other Topics Concern   Not on file  Social History Narrative   Not on file   Social Determinants of Health   Financial Resource Strain: Not on file  Food Insecurity: Not on file  Transportation Needs: Not on file  Physical Activity: Not on file  Stress: Not on file  Social Connections: Not on file  Intimate Partner Violence: Not on file    Tobacco Use: High Risk (11/11/2020)   Patient History  Smoking Tobacco Use: Some Days    Smokeless Tobacco Use: Unknown    Passive Exposure: Not on file   Social History   Substance and Sexual Activity  Alcohol Use Yes   Alcohol/week: 7.0 standard drinks of alcohol   Types: 7 Shots of liquor per week    Family History  Problem Relation Age of Onset   Cancer Mother        colon   Cancer Father        ? renal; metastatic to lung, cns    Review of Systems  Constitutional:  Negative for chills and fever.  HENT: Negative.    Eyes: Negative.   Respiratory:  Negative for cough and shortness of breath.   Cardiovascular:  Negative for chest pain and palpitations.  Gastrointestinal:  Negative for abdominal  pain, constipation, diarrhea, nausea and vomiting.  Genitourinary:  Negative for dysuria, frequency and urgency.  Musculoskeletal:  Positive for joint pain.  Skin:  Negative for rash.    Objective:  Physical Exam: Well nourished and well developed.  General: Alert and oriented x3, cooperative and pleasant, no acute distress.  Head: normocephalic, atraumatic, neck supple.  Eyes: EOMI.  Abdomen: non-tender to palpation and soft, normoactive bowel sounds. Musculoskeletal: Bilateral Hip Exam:  The range of motion: normal without discomfort.   Right Knee Exam:  No effusion present. No swelling present.  The range of motion is: 5 to 120 degrees.  Moderate crepitus on range of motion of the knee.  No medial joint line tenderness.  No lateral joint line tenderness.  The knee is stable.   Left Knee Exam:  No effusion present. No swelling present.  The Range of motion is: 5 to 120 degrees.  Moderate crepitus on range of motion of the knee.  Positive medial joint line tenderness.  Slight lateral joint line tenderness.  The knee is stable.  Calves soft and nontender. Motor function intact in LE. Strength 5/5 LE bilaterally. Neuro: Distal pulses 2+. Sensation to light touch intact in LE.  Vital signs in last 24 hours: BP: ()/()  Arterial Line BP: ()/()   Imaging Review Plain radiographs demonstrate moderate degenerative joint disease of the left knee. The overall alignment is neutral. The bone quality appears to be adequate for age and reported activity level.  Assessment/Plan:  End stage arthritis, left knee   The patient history, physical examination, clinical judgment of the provider and imaging studies are consistent with end stage degenerative joint disease of the left knee and total knee arthroplasty is deemed medically necessary. The treatment options including medical management, injection therapy arthroscopy and arthroplasty were discussed at length. The risks and benefits  of total knee arthroplasty were presented and reviewed. The risks due to aseptic loosening, infection, stiffness, patella tracking problems, thromboembolic complications and other imponderables were discussed. The patient acknowledged the explanation, agreed to proceed with the plan and consent was signed. Patient is being admitted for inpatient treatment for surgery, pain control, PT, OT, prophylactic antibiotics, VTE prophylaxis, progressive ambulation and ADLs and discharge planning. The patient is planning to be discharged  home .  Patient's anticipated LOS is less than 2 midnights, meeting these requirements: - Lives within 1 hour of care - Has a competent adult at home to recover with post-op - NO history of  - Chronic pain requiring opioids  - Diabetes  - Coronary Artery Disease  - Heart failure  - Heart attack  - Stroke  - DVT/VTE  - Cardiac arrhythmia  -  Respiratory Failure/COPD  - Renal failure  - Anemia  - Advanced Liver disease  Therapy Plans: EO Disposition: Home with Friend Planned DVT Prophylaxis: Xarelto 10 mg (hx of thyroid cancer) DME Needed: RW PCP: Max Vinograd, PA-C (clearance received) TXA: IV Allergies: sulfa (hives) Anesthesia Concerns: None BMI: 31.6 Last HgbA1c: 6.6  [facility]: [facility] ([place])  - Patient was instructed on what medications to stop prior to surgery. - Follow-up visit in 2 weeks with Dr. Wynelle Link - Begin physical therapy following surgery - Pre-operative lab work as pre-surgical testing - Prescriptions will be provided in hospital at time of discharge  R. Jaynie Bream, PA-C Orthopedic Surgery [facility] [place]

## 2022-05-04 NOTE — Progress Notes (Signed)
DUE TO COVID-19 ONLY  2 VISITOR IS ALLOWED TO COME WITH YOU AND STAY IN THE WAITING ROOM ONLY DURING PRE OP AND PROCEDURE DAY OF SURGERY.   4 VISITOR  MAY VISIT WITH YOU AFTER SURGERY IN YOUR PRIVATE ROOM DURING VISITING HOURS ONLY! YOU MAY HAVE ONE PERSON SPEND THE NITE WITH YOU IN YOUR ROOM AFTER SURGERY.       Your procedure is scheduled on:                05/22/22             Report to [facility] Main  Entrance   Report to admitting at    1000            AM DO NOT BRING INSURANCE CARD, PICTURE ID OR WALLET DAY OF SURGERY.      Call this number if you have problems the morning of surgery [phone]    REMEMBER: NO  SOLID FOODS , CANDY, GUM OR MINTS AFTER [place] .       Marland Kitchen CLEAR LIQUIDS UNTIL   0930am              DAY OF SURGERY.      PLEASE FINISH  g2 lower sugar  DRINK PER SURGEON ORDER  WHICH NEEDS TO BE COMPLETED AT    0930am      MORNING OF SURGERY.       CLEAR LIQUID DIET   Foods Allowed      WATER BLACK COFFEE ( SUGAR OK, NO MILK, CREAM OR CREAMER) REGULAR AND DECAF  TEA ( SUGAR OK NO MILK, CREAM, OR CREAMER) REGULAR AND DECAF  PLAIN JELLO ( NO RED)  FRUIT ICES ( NO RED, NO FRUIT PULP)  POPSICLES ( NO RED)  JUICE- APPLE, WHITE GRAPE AND WHITE CRANBERRY  SPORT DRINK LIKE GATORADE ( NO RED)  CLEAR BROTH ( VEGETABLE , CHICKEN OR BEEF)                                                                     BRUSH YOUR TEETH MORNING OF SURGERY AND RINSE YOUR MOUTH OUT, NO CHEWING GUM CANDY OR MINTS.     Take these medicines the morning of surgery with A SIP OF WATER:  coreg, synthroid,    DO NOT TAKE ANY DIABETIC MEDICATIONS DAY OF YOUR SURGERY                               You may not have any metal on your body including hair pins and              piercings  Do not wear jewelry, make-up, lotions, powders or perfumes, deodorant             Do not wear nail polish on your fingernails.              IF YOU ARE A MALE AND WANT TO SHAVE  UNDER ARMS OR LEGS PRIOR TO SURGERY YOU MUST DO SO AT LEAST 48 HOURS PRIOR TO SURGERY.              Men may shave face and neck.  Do not bring valuables to the hospital. [place].  Contacts, dentures or bridgework may not be worn into surgery.  Leave suitcase in the car. After surgery it may be brought to your room.     Patients discharged the day of surgery will not be allowed to drive home. IF YOU ARE HAVING SURGERY AND GOING HOME THE SAME DAY, YOU MUST HAVE AN ADULT TO DRIVE YOU HOME AND BE WITH YOU FOR 24 HOURS. YOU MAY GO HOME BY TAXI OR UBER OR ORTHERWISE, BUT AN ADULT MUST ACCOMPANY YOU HOME AND STAY WITH YOU FOR 24 HOURS.                Please read over the following fact sheets you were given: _____________________________________________________________________  [facility] - Preparing for Surgery Before surgery, you can play an important role.  Because skin is not sterile, your skin needs to be as free of germs as possible.  You can reduce the number of germs on your skin by washing with CHG (chlorahexidine gluconate) soap before surgery.  CHG is an antiseptic cleaner which kills germs and bonds with the skin to continue killing germs even after washing. Please DO NOT use if you have an allergy to CHG or antibacterial soaps.  If your skin becomes reddened/irritated stop using the CHG and inform your nurse when you arrive at Short Stay. Do not shave (including legs and underarms) for at least 48 hours prior to the first CHG shower.  You may shave your face/neck. Please follow these instructions carefully:  1.  Shower with CHG Soap the night before surgery and the  morning of Surgery.  2.  If you choose to wash your hair, wash your hair first as usual with your  normal  shampoo.  3.  After you shampoo, rinse your hair and body thoroughly to remove the  shampoo.                           4.  Use CHG as you would any other liquid soap.   You can apply chg directly  to the skin and wash                       Gently with a scrungie or clean washcloth.  5.  Apply the CHG Soap to your body ONLY FROM THE NECK DOWN.   Do not use on face/ open                           Wound or open sores. Avoid contact with eyes, ears mouth and genitals (private parts).                       Wash face,  Genitals (private parts) with your normal soap.             6.  Wash thoroughly, paying special attention to the area where your surgery  will be performed.  7.  Thoroughly rinse your body with warm water from the neck down.  8.  DO NOT shower/wash with your normal soap after using and rinsing off  the CHG Soap.                9.  Pat yourself dry with a clean towel.  10.  Wear clean pajamas.            11.  Place clean sheets on your bed the night of your first shower and do not  sleep with pets. Day of Surgery : Do not apply any lotions/deodorants the morning of surgery.  Please wear clean clothes to the hospital/surgery center.  FAILURE TO FOLLOW THESE INSTRUCTIONS MAY RESULT IN THE CANCELLATION OF YOUR SURGERY PATIENT SIGNATURE_________________________________  NURSE SIGNATURE__________________________________  ________________________________________________________________________

## 2022-05-04 NOTE — Progress Notes (Signed)
Anesthesia Review:  PCP: Max Vinogard LOV 03/17/22  Cardiologist : Chest x-ray : EKG : Echo : Stress test: Cardiac Cath :  Activity level:  Sleep Study/ CPAP : Fasting Blood Sugar :      / Checks Blood Sugar -- times a day:   Blood Thinner/ Instructions /Last Dose: ASA / Instructions/ Last Dose :  DM- type  Hgba1c

## 2022-05-04 NOTE — Progress Notes (Incomplete Revision)
Anesthesia Review:  PCP: Max Vinogard LOV 03/17/22  Clearance on chart dated 03/17/22  Cardiologist : none  Chest x-ray : EKG : 05/09/22  Echo : Stress test: Cardiac Cath :  Activity level: can do a flight of stairs  Sleep Study/ CPAP : none  Fasting Blood Sugar :      / Checks Blood Sugar -- times a day:   Blood Thinner/ Instructions /Last Dose: ASA / Instructions/ Last Dose :  DM- type 2 Does not check glucose at home  Hgba1c  05/09/22-

## 2022-05-09 ENCOUNTER — Encounter (HOSPITAL_COMMUNITY)
Admission: RE | Admit: 2022-05-09 | Discharge: 2022-05-09 | Disposition: A | Discharge status: Home / Self Care | Payer: Medicare Other | Source: Ambulatory Visit | Attending: Orthopedic Surgery | Admitting: Orthopedic Surgery

## 2022-05-09 ENCOUNTER — Encounter (HOSPITAL_COMMUNITY): Payer: Self-pay

## 2022-05-09 ENCOUNTER — Other Ambulatory Visit: Payer: Self-pay

## 2022-05-09 DIAGNOSIS — Z01818 Encounter for other preprocedural examination: Secondary | ICD-10-CM | POA: Insufficient documentation

## 2022-05-09 HISTORY — DX: Type 2 diabetes mellitus without complications: E11.9

## 2022-05-09 HISTORY — DX: Unspecified osteoarthritis, unspecified site: M19.90

## 2022-05-09 HISTORY — DX: Malignant (primary) neoplasm, unspecified: C80.1

## 2022-05-09 HISTORY — DX: Hypothyroidism, unspecified: E03.9

## 2022-05-09 HISTORY — DX: Essential (primary) hypertension: I10

## 2022-05-09 LAB — CBC
HCT: 41.4 % (ref 39.0–52.0)
Hemoglobin: 14.3 g/dL (ref 13.0–17.0)
MCH: 31.4 pg (ref 26.0–34.0)
MCHC: 34.5 g/dL (ref 30.0–36.0)
MCV: 91 fL (ref 80.0–100.0)
Platelets: 216 10*3/uL (ref 150–400)
RBC: 4.55 MIL/uL (ref 4.22–5.81)
RDW: 12.8 % (ref 11.5–15.5)
WBC: 7.7 10*3/uL (ref 4.0–10.5)
nRBC: 0 % (ref 0.0–0.2)

## 2022-05-09 LAB — BASIC METABOLIC PANEL
Anion gap: 9 (ref 5–15)
BUN: 29 mg/dL — ABNORMAL HIGH (ref 8–23)
CO2: 24 mmol/L (ref 22–32)
Calcium: 9.8 mg/dL (ref 8.9–10.3)
Chloride: 106 mmol/L (ref 98–111)
Creatinine, Ser: 1.08 mg/dL (ref 0.61–1.24)
GFR, Estimated: >60 mL/min (ref >60)
Glucose, Bld: 122 mg/dL — ABNORMAL HIGH (ref 70–99)
Potassium: 4.2 mmol/L (ref 3.5–5.1)
Sodium: 139 mmol/L (ref 135–145)

## 2022-05-09 LAB — SURGICAL PCR SCREEN
MRSA, PCR: NEGATIVE
Staphylococcus aureus: NEGATIVE

## 2022-05-09 LAB — HEMOGLOBIN A1C
Hgb A1c MFr Bld: 5.8 % — ABNORMAL HIGH (ref 4.8–5.6)
Mean Plasma Glucose: 119.76 mg/dL

## 2022-05-09 LAB — GLUCOSE, CAPILLARY: Glucose-Capillary: 114 mg/dL — ABNORMAL HIGH (ref 70–99)

## 2022-05-19 NOTE — Anesthesia Preprocedure Evaluation (Signed)
Anesthesia Evaluation  Patient identified by MRN, date of birth, ID band Patient awake    Reviewed: Allergy & Precautions, NPO status , Patient's Chart, lab work & pertinent test results  Airway Mallampati: IV  TM Distance: >3 FB Neck ROM: Full    Dental no notable dental hx. (+) Poor Dentition, Dental Advisory Given, Chipped,    Pulmonary Current Smoker,  Cigars- one per day    Pulmonary exam normal breath sounds clear to auscultation       Cardiovascular hypertension (158/85 in preop, per pt normally lower), Pt. on medications Normal cardiovascular exam Rhythm:Regular Rate:Normal     Neuro/Psych PSYCHIATRIC DISORDERS Anxiety negative neurological ROS     GI/Hepatic negative GI ROS, (+)       alcohol use,   Endo/Other  diabetes, Well Controlled, Type 2, Oral Hypoglycemic AgentsHypothyroidism FS 168 in preop, does not check fingersticks at home  Renal/GU negative Renal ROS  negative genitourinary   Musculoskeletal  (+) Arthritis , Osteoarthritis,  L knee OA    Abdominal   Peds  Hematology negative hematology ROS (+) Hb 14.3, plt 216   Anesthesia Other Findings   Reproductive/Obstetrics negative OB ROS                            Anesthesia Physical Anesthesia Plan  ASA: 3  Anesthesia Plan: Spinal, Regional and MAC   Post-op Pain Management: Ofirmev IV (intra-op)* and Regional block*   Induction:   PONV Risk Score and Plan: 2 and Propofol infusion and TIVA  Airway Management Planned: Natural Airway and Nasal Cannula  Additional Equipment: None  Intra-op Plan:   Post-operative Plan:   Informed Consent: I have reviewed the patients History and Physical, chart, labs and discussed the procedure including the risks, benefits and alternatives for the proposed anesthesia with the patient or authorized representative who has indicated his/her understanding and acceptance.        Plan Discussed with: CRNA  Anesthesia Plan Comments:         Anesthesia Quick Evaluation

## 2022-05-22 ENCOUNTER — Encounter (HOSPITAL_COMMUNITY): Payer: Self-pay | Admitting: Orthopedic Surgery

## 2022-05-22 ENCOUNTER — Ambulatory Visit (HOSPITAL_BASED_OUTPATIENT_CLINIC_OR_DEPARTMENT_OTHER): Payer: Medicare Other | Admitting: Anesthesiology

## 2022-05-22 ENCOUNTER — Encounter (HOSPITAL_COMMUNITY)
Admission: RE | Disposition: A | Discharge status: Home / Self Care | Payer: Self-pay | Source: Home / Self Care | Attending: Orthopedic Surgery

## 2022-05-22 ENCOUNTER — Ambulatory Visit (HOSPITAL_COMMUNITY): Payer: Medicare Other | Admitting: Physician Assistant

## 2022-05-22 ENCOUNTER — Observation Stay (HOSPITAL_COMMUNITY)
Admission: RE | Admit: 2022-05-22 | Discharge: 2022-05-23 | Disposition: A | Discharge status: Home / Self Care | Payer: Medicare Other | Attending: Orthopedic Surgery | Admitting: Orthopedic Surgery

## 2022-05-22 ENCOUNTER — Other Ambulatory Visit: Payer: Self-pay

## 2022-05-22 DIAGNOSIS — I10 Essential (primary) hypertension: Secondary | ICD-10-CM | POA: Diagnosis not present

## 2022-05-22 DIAGNOSIS — M1712 Unilateral primary osteoarthritis, left knee: Principal | ICD-10-CM | POA: Diagnosis present

## 2022-05-22 DIAGNOSIS — Z7984 Long term (current) use of oral hypoglycemic drugs: Secondary | ICD-10-CM | POA: Insufficient documentation

## 2022-05-22 DIAGNOSIS — Z79899 Other long term (current) drug therapy: Secondary | ICD-10-CM | POA: Insufficient documentation

## 2022-05-22 DIAGNOSIS — E119 Type 2 diabetes mellitus without complications: Secondary | ICD-10-CM | POA: Diagnosis not present

## 2022-05-22 DIAGNOSIS — Z85850 Personal history of malignant neoplasm of thyroid: Secondary | ICD-10-CM | POA: Insufficient documentation

## 2022-05-22 DIAGNOSIS — F17210 Nicotine dependence, cigarettes, uncomplicated: Secondary | ICD-10-CM

## 2022-05-22 DIAGNOSIS — Z01818 Encounter for other preprocedural examination: Secondary | ICD-10-CM

## 2022-05-22 DIAGNOSIS — F17290 Nicotine dependence, other tobacco product, uncomplicated: Secondary | ICD-10-CM | POA: Diagnosis not present

## 2022-05-22 DIAGNOSIS — M179 Osteoarthritis of knee, unspecified: Secondary | ICD-10-CM | POA: Diagnosis present

## 2022-05-22 DIAGNOSIS — E039 Hypothyroidism, unspecified: Secondary | ICD-10-CM | POA: Diagnosis not present

## 2022-05-22 HISTORY — PX: TOTAL KNEE ARTHROPLASTY: SHX125

## 2022-05-22 LAB — GLUCOSE, CAPILLARY
Glucose-Capillary: 168 mg/dL — ABNORMAL HIGH (ref 70–99)
Glucose-Capillary: 159 mg/dL — ABNORMAL HIGH (ref 70–99)
Glucose-Capillary: 235 mg/dL — ABNORMAL HIGH (ref 70–99)
Glucose-Capillary: 239 mg/dL — ABNORMAL HIGH (ref 70–99)

## 2022-05-22 SURGERY — ARTHROPLASTY, KNEE, TOTAL
Anesthesia: Monitor Anesthesia Care | Site: Knee | Laterality: Left

## 2022-05-22 MED ORDER — ONDANSETRON HCL 4 MG/2ML IJ SOLN
INTRAMUSCULAR | Status: DC | PRN
  Administered 2022-05-22: 4 mg via INTRAVENOUS

## 2022-05-22 MED ORDER — SODIUM CHLORIDE 0.9 % IR SOLN
Status: DC | PRN
  Administered 2022-05-22: 1000 mL

## 2022-05-22 MED ORDER — BUPIVACAINE LIPOSOME 1.3 % IJ SUSP
INTRAMUSCULAR | Status: AC
  Filled 2022-05-22: qty 20

## 2022-05-22 MED ORDER — SODIUM CHLORIDE (PF) 0.9 % IJ SOLN
INTRAMUSCULAR | Status: AC
  Filled 2022-05-22: qty 10

## 2022-05-22 MED ORDER — DEXAMETHASONE SODIUM PHOSPHATE 10 MG/ML IJ SOLN
INTRAMUSCULAR | Status: DC | PRN
  Administered 2022-05-22: 10 mg

## 2022-05-22 MED ORDER — TRANEXAMIC ACID-NACL 1000-0.7 MG/100ML-% IV SOLN
1000.0000 mg | INTRAVENOUS | Status: AC
  Administered 2022-05-22: 1000 mg via INTRAVENOUS
  Filled 2022-05-22: qty 100

## 2022-05-22 MED ORDER — POLYETHYLENE GLYCOL 3350 17 G PO PACK: 17.0000 g | PACK | Freq: Every day | ORAL | Status: DC | PRN

## 2022-05-22 MED ORDER — EPHEDRINE SULFATE-NACL 50-0.9 MG/10ML-% IV SOSY
PREFILLED_SYRINGE | INTRAVENOUS | Status: DC | PRN
  Administered 2022-05-22: 10 mg via INTRAVENOUS

## 2022-05-22 MED ORDER — METOCLOPRAMIDE HCL 5 MG PO TABS: 5.0000 mg | ORAL_TABLET | Freq: Three times a day (TID) | ORAL | Status: DC | PRN

## 2022-05-22 MED ORDER — METOCLOPRAMIDE HCL 5 MG/ML IJ SOLN: 5.0000 mg | Freq: Three times a day (TID) | INTRAMUSCULAR | Status: DC | PRN

## 2022-05-22 MED ORDER — PHENYLEPHRINE HCL-NACL 20-0.9 MG/250ML-% IV SOLN
INTRAVENOUS | Status: AC
  Filled 2022-05-22: qty 250

## 2022-05-22 MED ORDER — DEXAMETHASONE SODIUM PHOSPHATE 10 MG/ML IJ SOLN
INTRAMUSCULAR | Status: AC
  Filled 2022-05-22: qty 1

## 2022-05-22 MED ORDER — METHOCARBAMOL 500 MG IVPB - SIMPLE MED: 500.0000 mg | Freq: Four times a day (QID) | INTRAVENOUS | Status: DC | PRN

## 2022-05-22 MED ORDER — SODIUM CHLORIDE (PF) 0.9 % IJ SOLN
INTRAMUSCULAR | Status: AC
  Filled 2022-05-22: qty 50

## 2022-05-22 MED ORDER — TRAMADOL HCL 50 MG PO TABS
50.0000 mg | ORAL_TABLET | Freq: Four times a day (QID) | ORAL | Status: DC | PRN
  Administered 2022-05-22 – 2022-05-23 (×2): 100 mg via ORAL
  Filled 2022-05-22 (×2): qty 2

## 2022-05-22 MED ORDER — OXYCODONE HCL 5 MG/5ML PO SOLN: 5.0000 mg | Freq: Once | ORAL | Status: DC | PRN

## 2022-05-22 MED ORDER — ORAL CARE MOUTH RINSE: 15.0000 mL | Freq: Once | OROMUCOSAL | Status: AC

## 2022-05-22 MED ORDER — DOCUSATE SODIUM 100 MG PO CAPS
100.0000 mg | ORAL_CAPSULE | Freq: Two times a day (BID) | ORAL | Status: DC
  Administered 2022-05-22 – 2022-05-23 (×2): 100 mg via ORAL
  Filled 2022-05-22 (×2): qty 1

## 2022-05-22 MED ORDER — LACTATED RINGERS IV SOLN: INTRAVENOUS | Status: DC

## 2022-05-22 MED ORDER — CHLORHEXIDINE GLUCONATE 0.12 % MT SOLN
15.0000 mL | Freq: Once | OROMUCOSAL | Status: AC
  Administered 2022-05-22: 15 mL via OROMUCOSAL

## 2022-05-22 MED ORDER — PROPOFOL 1000 MG/100ML IV EMUL
INTRAVENOUS | Status: AC
  Filled 2022-05-22: qty 100

## 2022-05-22 MED ORDER — POVIDONE-IODINE 10 % EX SWAB: 2.0000 "application " | Freq: Once | CUTANEOUS | Status: DC

## 2022-05-22 MED ORDER — BUPIVACAINE LIPOSOME 1.3 % IJ SUSP: 20.0000 mL | Freq: Once | INTRAMUSCULAR | Status: DC

## 2022-05-22 MED ORDER — DIPHENHYDRAMINE HCL 12.5 MG/5ML PO ELIX: 12.5000 mg | ORAL_SOLUTION | ORAL | Status: DC | PRN

## 2022-05-22 MED ORDER — ONDANSETRON HCL 4 MG PO TABS: 4.0000 mg | ORAL_TABLET | Freq: Four times a day (QID) | ORAL | Status: DC | PRN

## 2022-05-22 MED ORDER — LEVOTHYROXINE SODIUM 100 MCG PO TABS
200.0000 ug | ORAL_TABLET | Freq: Every day | ORAL | Status: DC
  Administered 2022-05-23: 200 ug via ORAL
  Filled 2022-05-22: qty 2

## 2022-05-22 MED ORDER — ONDANSETRON HCL 4 MG/2ML IJ SOLN: 4.0000 mg | Freq: Once | INTRAMUSCULAR | Status: DC | PRN

## 2022-05-22 MED ORDER — PROPOFOL 10 MG/ML IV BOLUS
INTRAVENOUS | Status: DC | PRN
  Administered 2022-05-22: 20 mg via INTRAVENOUS
  Administered 2022-05-22: 30 mg via INTRAVENOUS
  Administered 2022-05-22: 20 mg via INTRAVENOUS

## 2022-05-22 MED ORDER — ACETAMINOPHEN 10 MG/ML IV SOLN
1000.0000 mg | Freq: Four times a day (QID) | INTRAVENOUS | Status: DC
  Administered 2022-05-22: 1000 mg via INTRAVENOUS
  Filled 2022-05-22: qty 100

## 2022-05-22 MED ORDER — DEXAMETHASONE SODIUM PHOSPHATE 10 MG/ML IJ SOLN
INTRAMUSCULAR | Status: DC | PRN
  Administered 2022-05-22: 10 mg via INTRAVENOUS

## 2022-05-22 MED ORDER — ONDANSETRON HCL 4 MG/2ML IJ SOLN
INTRAMUSCULAR | Status: AC
Start: 2022-05-22 — End: ?
  Filled 2022-05-22: qty 2

## 2022-05-22 MED ORDER — PROPOFOL 10 MG/ML IV BOLUS
INTRAVENOUS | Status: AC
  Filled 2022-05-22: qty 20

## 2022-05-22 MED ORDER — METHOCARBAMOL 500 MG PO TABS
500.0000 mg | ORAL_TABLET | Freq: Four times a day (QID) | ORAL | Status: DC | PRN
  Administered 2022-05-22 – 2022-05-23 (×2): 500 mg via ORAL
  Filled 2022-05-22 (×2): qty 1

## 2022-05-22 MED ORDER — ONDANSETRON HCL 4 MG/2ML IJ SOLN: 4.0000 mg | Freq: Four times a day (QID) | INTRAMUSCULAR | Status: DC | PRN

## 2022-05-22 MED ORDER — EPHEDRINE 5 MG/ML INJ
INTRAVENOUS | Status: AC
  Filled 2022-05-22: qty 5

## 2022-05-22 MED ORDER — SODIUM CHLORIDE 0.9 % IV SOLN: INTRAVENOUS | Status: DC

## 2022-05-22 MED ORDER — HYDROCHLOROTHIAZIDE 12.5 MG PO TABS
12.5000 mg | ORAL_TABLET | Freq: Every day | ORAL | Status: DC
  Administered 2022-05-23: 12.5 mg via ORAL
  Filled 2022-05-22: qty 1

## 2022-05-22 MED ORDER — HYDROMORPHONE HCL 1 MG/ML IJ SOLN: 0.2500 mg | INTRAMUSCULAR | Status: DC | PRN

## 2022-05-22 MED ORDER — FENTANYL CITRATE PF 50 MCG/ML IJ SOSY
50.0000 ug | PREFILLED_SYRINGE | INTRAMUSCULAR | Status: AC
  Administered 2022-05-22: 100 ug via INTRAVENOUS
  Filled 2022-05-22: qty 2

## 2022-05-22 MED ORDER — AMISULPRIDE (ANTIEMETIC) 5 MG/2ML IV SOLN: 10.0000 mg | Freq: Once | INTRAVENOUS | Status: DC | PRN

## 2022-05-22 MED ORDER — SODIUM CHLORIDE (PF) 0.9 % IJ SOLN
INTRAMUSCULAR | Status: DC | PRN
  Administered 2022-05-22: 60 mL via INTRAVENOUS

## 2022-05-22 MED ORDER — ROPIVACAINE HCL 5 MG/ML IJ SOLN
INTRAMUSCULAR | Status: DC | PRN
  Administered 2022-05-22: 30 mL via PERINEURAL

## 2022-05-22 MED ORDER — BISACODYL 10 MG RE SUPP: 10.0000 mg | Freq: Every day | RECTAL | Status: DC | PRN

## 2022-05-22 MED ORDER — BUPIVACAINE LIPOSOME 1.3 % IJ SUSP
INTRAMUSCULAR | Status: DC | PRN
  Administered 2022-05-22: 20 mL

## 2022-05-22 MED ORDER — INSULIN ASPART 100 UNIT/ML IJ SOLN
0.0000 [IU] | Freq: Every day | INTRAMUSCULAR | Status: DC
  Administered 2022-05-22: 2 [IU] via SUBCUTANEOUS

## 2022-05-22 MED ORDER — MORPHINE SULFATE (PF) 2 MG/ML IV SOLN
1.0000 mg | INTRAVENOUS | Status: DC | PRN
  Administered 2022-05-22: 1 mg via INTRAVENOUS
  Filled 2022-05-22: qty 1

## 2022-05-22 MED ORDER — POVIDONE-IODINE 10 % EX SWAB
2.0000 | Freq: Once | CUTANEOUS | Status: AC
  Administered 2022-05-22: 2 via TOPICAL

## 2022-05-22 MED ORDER — CEFAZOLIN SODIUM-DEXTROSE 2-4 GM/100ML-% IV SOLN
2.0000 g | INTRAVENOUS | Status: AC
  Administered 2022-05-22: 2 g via INTRAVENOUS
  Filled 2022-05-22: qty 100

## 2022-05-22 MED ORDER — INSULIN ASPART 100 UNIT/ML IJ SOLN
0.0000 [IU] | Freq: Three times a day (TID) | INTRAMUSCULAR | Status: DC
  Administered 2022-05-23: 3 [IU] via SUBCUTANEOUS

## 2022-05-22 MED ORDER — ACETAMINOPHEN 500 MG PO TABS
1000.0000 mg | ORAL_TABLET | Freq: Four times a day (QID) | ORAL | Status: DC
  Administered 2022-05-22 – 2022-05-23 (×3): 1000 mg via ORAL
  Filled 2022-05-22 (×4): qty 2

## 2022-05-22 MED ORDER — PROPOFOL 500 MG/50ML IV EMUL
INTRAVENOUS | Status: DC | PRN
  Administered 2022-05-22: 50 ug/kg/min via INTRAVENOUS

## 2022-05-22 MED ORDER — MENTHOL 3 MG MT LOZG: 1.0000 | LOZENGE | OROMUCOSAL | Status: DC | PRN

## 2022-05-22 MED ORDER — RIVAROXABAN 10 MG PO TABS
10.0000 mg | ORAL_TABLET | Freq: Every day | ORAL | Status: DC
  Administered 2022-05-23: 10 mg via ORAL
  Filled 2022-05-22: qty 1

## 2022-05-22 MED ORDER — CEFAZOLIN SODIUM-DEXTROSE 2-4 GM/100ML-% IV SOLN
2.0000 g | Freq: Four times a day (QID) | INTRAVENOUS | Status: AC
  Administered 2022-05-22 – 2022-05-23 (×2): 2 g via INTRAVENOUS
  Filled 2022-05-22 (×2): qty 100

## 2022-05-22 MED ORDER — FLEET ENEMA 7-19 GM/118ML RE ENEM: 1.0000 | ENEMA | Freq: Once | RECTAL | Status: DC | PRN

## 2022-05-22 MED ORDER — MIDAZOLAM HCL 2 MG/2ML IJ SOLN
1.0000 mg | INTRAMUSCULAR | Status: AC
  Administered 2022-05-22: 2 mg via INTRAVENOUS
  Filled 2022-05-22: qty 2

## 2022-05-22 MED ORDER — ROSUVASTATIN CALCIUM 20 MG PO TABS
20.0000 mg | ORAL_TABLET | Freq: Every day | ORAL | Status: DC
  Administered 2022-05-23: 20 mg via ORAL
  Filled 2022-05-22: qty 1

## 2022-05-22 MED ORDER — CARVEDILOL 6.25 MG PO TABS
6.2500 mg | ORAL_TABLET | Freq: Two times a day (BID) | ORAL | Status: DC
  Administered 2022-05-22 – 2022-05-23 (×2): 6.25 mg via ORAL
  Filled 2022-05-22 (×2): qty 1

## 2022-05-22 MED ORDER — LOSARTAN POTASSIUM 50 MG PO TABS
100.0000 mg | ORAL_TABLET | Freq: Every day | ORAL | Status: DC
  Administered 2022-05-23: 100 mg via ORAL
  Filled 2022-05-22: qty 2

## 2022-05-22 MED ORDER — OXYCODONE HCL 5 MG PO TABS: 5.0000 mg | ORAL_TABLET | Freq: Once | ORAL | Status: DC | PRN

## 2022-05-22 MED ORDER — PHENOL 1.4 % MT LIQD
1.0000 | OROMUCOSAL | Status: DC | PRN
Start: 2022-05-22 — End: 2022-05-23

## 2022-05-22 MED ORDER — OXYCODONE HCL 5 MG PO TABS
5.0000 mg | ORAL_TABLET | ORAL | Status: DC | PRN
  Administered 2022-05-22: 5 mg via ORAL
  Administered 2022-05-22: 10 mg via ORAL
  Administered 2022-05-22: 5 mg via ORAL
  Administered 2022-05-23 (×2): 10 mg via ORAL
  Filled 2022-05-22 (×2): qty 2
  Filled 2022-05-22 (×2): qty 1
  Filled 2022-05-22: qty 2

## 2022-05-22 MED ORDER — DEXAMETHASONE SODIUM PHOSPHATE 10 MG/ML IJ SOLN: 8.0000 mg | Freq: Once | INTRAMUSCULAR | Status: DC

## 2022-05-22 MED ORDER — BUPIVACAINE IN DEXTROSE 0.75-8.25 % IT SOLN
INTRATHECAL | Status: DC | PRN
  Administered 2022-05-22: 1.8 mL via INTRATHECAL

## 2022-05-22 SURGICAL SUPPLY — 59 items
ATTUNE MED DOME PAT 41 KNEE (Knees) ×1 IMPLANT
ATTUNE PS FEM LT SZ 7 CEM KNEE (Femur) ×1 IMPLANT
ATTUNE PSRP INSR SZ7 8 KNEE (Insert) ×1 IMPLANT
BAG COUNTER SPONGE SURGICOUNT (BAG) IMPLANT
BAG SPEC THK2 15X12 ZIP CLS (MISCELLANEOUS) ×1
BAG SPNG CNTER NS LX DISP (BAG)
BAG ZIPLOCK 12X15 (MISCELLANEOUS) ×3 IMPLANT
BASE TIBIAL ROT PLAT SZ 7 KNEE (Knees) IMPLANT
BLADE SAG 18X100X1.27 (BLADE) ×3 IMPLANT
BLADE SAW SGTL 11.0X1.19X90.0M (BLADE) ×3 IMPLANT
BNDG ELASTIC 6X5.8 VLCR STR LF (GAUZE/BANDAGES/DRESSINGS) ×3 IMPLANT
BOWL SMART MIX CTS (DISPOSABLE) ×3 IMPLANT
BSPLAT TIB 7 CMNT ROT PLAT STR (Knees) ×1 IMPLANT
CEMENT HV SMART SET (Cement) ×6 IMPLANT
CLSR STERI-STRIP ANTIMIC 1/2X4 (GAUZE/BANDAGES/DRESSINGS) ×1 IMPLANT
COVER SURGICAL LIGHT HANDLE (MISCELLANEOUS) ×3 IMPLANT
CUFF TOURN SGL QUICK 34 (TOURNIQUET CUFF) ×2
CUFF TRNQT CYL 34X4.125X (TOURNIQUET CUFF) ×2 IMPLANT
DRAPE INCISE IOBAN 66X45 STRL (DRAPES) ×3 IMPLANT
DRAPE U-SHAPE 47X51 STRL (DRAPES) ×3 IMPLANT
DRSG AQUACEL AG ADV 3.5X10 (GAUZE/BANDAGES/DRESSINGS) ×3 IMPLANT
DURAPREP 26ML APPLICATOR (WOUND CARE) ×3 IMPLANT
ELECT REM PT RETURN 15FT ADLT (MISCELLANEOUS) ×3 IMPLANT
GLOVE BIO SURGEON STRL SZ 6.5 (GLOVE) IMPLANT
GLOVE BIO SURGEON STRL SZ7.5 (GLOVE) IMPLANT
GLOVE BIO SURGEON STRL SZ8 (GLOVE) ×3 IMPLANT
GLOVE BIOGEL PI IND STRL 6.5 (GLOVE) IMPLANT
GLOVE BIOGEL PI IND STRL 7.0 (GLOVE) IMPLANT
GLOVE BIOGEL PI IND STRL 8 (GLOVE) ×2 IMPLANT
GLOVE BIOGEL PI INDICATOR 6.5 (GLOVE)
GLOVE BIOGEL PI INDICATOR 7.0 (GLOVE)
GLOVE BIOGEL PI INDICATOR 8 (GLOVE) ×1
GOWN STRL REUS W/ TWL LRG LVL3 (GOWN DISPOSABLE) ×2 IMPLANT
GOWN STRL REUS W/ TWL XL LVL3 (GOWN DISPOSABLE) IMPLANT
GOWN STRL REUS W/TWL LRG LVL3 (GOWN DISPOSABLE) ×2
GOWN STRL REUS W/TWL XL LVL3 (GOWN DISPOSABLE)
HANDPIECE INTERPULSE COAX TIP (DISPOSABLE) ×2
HOLDER FOLEY CATH W/STRAP (MISCELLANEOUS) IMPLANT
IMMOBILIZER KNEE 20 (SOFTGOODS) ×2
IMMOBILIZER KNEE 20 THIGH 36 (SOFTGOODS) ×2 IMPLANT
KIT TURNOVER KIT A (KITS) IMPLANT
MANIFOLD NEPTUNE II (INSTRUMENTS) ×3 IMPLANT
NS IRRIG 1000ML POUR BTL (IV SOLUTION) ×3 IMPLANT
PACK TOTAL KNEE CUSTOM (KITS) ×3 IMPLANT
PADDING CAST COTTON 6X4 STRL (CAST SUPPLIES) ×5 IMPLANT
PROTECTOR NERVE ULNAR (MISCELLANEOUS) ×3 IMPLANT
SET HNDPC FAN SPRY TIP SCT (DISPOSABLE) ×2 IMPLANT
SPIKE FLUID TRANSFER (MISCELLANEOUS) ×3 IMPLANT
STRIP CLOSURE SKIN 1/2X4 (GAUZE/BANDAGES/DRESSINGS) ×6 IMPLANT
SUT MNCRL AB 4-0 PS2 18 (SUTURE) ×3 IMPLANT
SUT STRATAFIX 0 PDS 27 VIOLET (SUTURE) ×2
SUT VIC AB 2-0 CT1 27 (SUTURE) ×6
SUT VIC AB 2-0 CT1 TAPERPNT 27 (SUTURE) ×6 IMPLANT
SUTURE STRATFX 0 PDS 27 VIOLET (SUTURE) ×2 IMPLANT
TIBIAL BASE ROT PLAT SZ 7 KNEE (Knees) ×2 IMPLANT
TRAY FOLEY MTR SLVR 16FR STAT (SET/KITS/TRAYS/PACK) ×3 IMPLANT
TUBE SUCTION HIGH CAP CLEAR NV (SUCTIONS) ×3 IMPLANT
WATER STERILE IRR 1000ML POUR (IV SOLUTION) ×6 IMPLANT
WRAP KNEE MAXI GEL POST OP (GAUZE/BANDAGES/DRESSINGS) ×3 IMPLANT

## 2022-05-22 NOTE — Interval H&P Note (Signed)
History and Physical Interval Note:  05/22/2022 10:31 AM  Todd Roy  has presented today for surgery, with the diagnosis of Left knee osteoarthritis.  The various methods of treatment have been discussed with the patient and family. After consideration of risks, benefits and other options for treatment, the patient has consented to  Procedure(s): TOTAL KNEE ARTHROPLASTY (Left) as a surgical intervention.  The patient's history has been reviewed, patient examined, no change in status, stable for surgery.  I have reviewed the patient's chart and labs.  Questions were answered to the patient's satisfaction.     Pilar Plate Natoya Viscomi

## 2022-05-22 NOTE — Anesthesia Procedure Notes (Signed)
Anesthesia Regional Block: Adductor canal block   Pre-Anesthetic Checklist: , timeout performed,  Correct Patient, Correct Site, Correct Laterality,  Correct Procedure, Correct Position, site marked,  Risks and benefits discussed,  Surgical consent,  Pre-op evaluation,  At surgeon's request and post-op pain management  Laterality: Left  Prep: Maximum Sterile Barrier Precautions used, chloraprep       Needles:  Injection technique: Single-shot  Needle Type: Echogenic Stimulator Needle     Needle Length: 9cm  Needle Gauge: 22     Additional Needles:   Procedures:,,,, ultrasound used (permanent image in chart),,    Narrative:  Start time: 05/22/2022 10:50 AM End time: 05/22/2022 10:55 AM Injection made incrementally with aspirations every 5 mL.  Performed by: Personally  Anesthesiologist: Pervis Hocking, DO  Additional Notes: Monitors applied. No increased pain on injection. No increased resistance to injection. Injection made in 5cc increments. Good needle visualization. Patient tolerated procedure well.

## 2022-05-22 NOTE — Op Note (Signed)
OPERATIVE REPORT-TOTAL KNEE ARTHROPLASTY   Pre-operative diagnosis- Osteoarthritis  Left knee(s)  Post-operative diagnosis- Osteoarthritis Left knee(s)  Procedure-  Left  Total Knee Arthroplasty  Surgeon- Dione Plover. Tate Zagal, MD  Assistant- Jaynie Bream, PA-C   Anesthesia-   Adductor canal block and spinal  EBL- 25 ml   Drains None  Tourniquet time-  Total Tourniquet Time Documented: Thigh (Left) - 39 minutes Total: Thigh (Left) - 39 minutes     Complications- None  Condition-PACU - hemodynamically stable.   Brief Clinical Note   Todd Roy is a 76 y.o. year old male with end stage OA of his left knee with progressively worsening pain and dysfunction. He has constant pain, with activity and at rest and significant functional deficits with difficulties even with ADLs. He has had extensive non-op management including analgesics, injections of cortisone and viscosupplements, and home exercise program, but remains in significant pain with significant dysfunction. Radiographs show bone on bone arthritis medial and patellofemoral. He presents now for left Total Knee Arthroplasty.     Procedure in detail---   The patient is brought into the operating room and positioned supine on the operating table. After successful administration of  Adductor canal block and spinal,   a tourniquet is placed high on the  Left thigh(s) and the lower extremity is prepped and draped in the usual sterile fashion. Time out is performed by the operating team and then the  Left lower extremity is wrapped in Esmarch, knee flexed and the tourniquet inflated to 300 mmHg.       A midline incision is made with a ten blade through the subcutaneous tissue to the level of the extensor mechanism. A fresh blade is used to make a medial parapatellar arthrotomy. Soft tissue over the proximal medial tibia is subperiosteally elevated to the joint line with a knife and into the semimembranosus bursa with a Cobb  elevator. Soft tissue over the proximal lateral tibia is elevated with attention being paid to avoiding the patellar tendon on the tibial tubercle. The patella is everted, knee flexed 90 degrees and the ACL and PCL are removed. Findings are bone on bone medial and patellofemoral with large global osteophytes.        The drill is used to create a starting hole in the distal femur and the canal is thoroughly irrigated with sterile saline to remove the fatty contents. The 5 degree Left  valgus alignment guide is placed into the femoral canal and the distal femoral cutting block is pinned to remove 9 mm off the distal femur. Resection is made with an oscillating saw.      The tibia is subluxed forward and the menisci are removed. The extramedullary alignment guide is placed referencing proximally at the medial aspect of the tibial tubercle and distally along the second metatarsal axis and tibial crest. The block is pinned to remove 87m off the more deficient medial  side. Resection is made with an oscillating saw. Size 7 is the most appropriate size for the tibia and the proximal tibia is prepared with the modular drill and keel punch for that size.      The femoral sizing guide is placed and size 7 is most appropriate. Rotation is marked off the epicondylar axis and confirmed by creating a rectangular flexion gap at 90 degrees. The size 7 cutting block is pinned in this rotation and the anterior, posterior and chamfer cuts are made with the oscillating saw. The intercondylar block is then placed and  that cut is made.      Trial size 7 tibial component, trial size 7 posterior stabilized femur and a 8  mm posterior stabilized rotating platform insert trial is placed. Full extension is achieved with excellent varus/valgus and anterior/posterior balance throughout full range of motion. The patella is everted and thickness measured to be 27  mm. Free hand resection is taken to 15 mm, a 41 template is placed, lug holes  are drilled, trial patella is placed, and it tracks normally. Osteophytes are removed off the posterior femur with the trial in place. All trials are removed and the cut bone surfaces prepared with pulsatile lavage. Cement is mixed and once ready for implantation, the size 7 tibial implant, size  7 posterior stabilized femoral component, and the size 41 patella are cemented in place and the patella is held with the clamp. The trial insert is placed and the knee held in full extension. The Exparel (20 ml mixed with 60 ml saline) is injected into the extensor mechanism, posterior capsule, medial and lateral gutters and subcutaneous tissues.  All extruded cement is removed and once the cement is hard the permanent 8 mm posterior stabilized rotating platform insert is placed into the tibial tray.      The wound is copiously irrigated with saline solution and the extensor mechanism closed with # 0 Stratofix suture. The tourniquet is released for a total tourniquet time of 39  minutes. Flexion against gravity is 140 degrees and the patella tracks normally. Subcutaneous tissue is closed with 2.0 vicryl and subcuticular with running 4.0 Monocryl. The incision is cleaned and dried and steri-strips and a bulky sterile dressing are applied. The limb is placed into a knee immobilizer and the patient is awakened and transported to recovery in stable condition.      Please note that a surgical assistant was a medical necessity for this procedure in order to perform it in a safe and expeditious manner. Surgical assistant was necessary to retract the ligaments and vital neurovascular structures to prevent injury to them and also necessary for proper positioning of the limb to allow for anatomic placement of the prosthesis.   Dione Plover Graziella Connery, MD    05/22/2022, 1:54 PM

## 2022-05-22 NOTE — Progress Notes (Signed)
Orthopedic Tech Progress Note Patient Details:  Todd Roy November 12, 1946 [id number]  CPM Left Knee CPM Left Knee: On Left Knee Flexion (Degrees): 40 Left Knee Extension (Degrees): 10  Post Interventions Patient Tolerated: Well Instructions Provided: Care of device, Adjustment of device  [name] 05/22/2022, 1:12 PM

## 2022-05-22 NOTE — Anesthesia Procedure Notes (Signed)
Date/Time: 05/22/2022 12:47 PM  Performed by: Sharlette Dense, CRNAOxygen Delivery Method: Simple face mask

## 2022-05-22 NOTE — Transfer of Care (Signed)
Immediate Anesthesia Transfer of Care Note  Patient: Todd Roy  Procedure(s) Performed: TOTAL KNEE ARTHROPLASTY (Left: Knee)  Patient Location: PACU  Anesthesia Type:Spinal  Level of Consciousness: awake, alert  and oriented  Airway & Oxygen Therapy: Patient Spontanous Breathing and Patient connected to face mask oxygen  Post-op Assessment: Report given to RN and Post -op Vital signs reviewed and stable  Post vital signs: Reviewed and stable  Last Vitals:  Vitals Value Taken Time  BP 129/78 05/22/22 1426  Temp    Pulse 62 05/22/22 1428  Resp 17 05/22/22 1428  SpO2 100 % 05/22/22 1428  Vitals shown include unvalidated device data.  Last Pain:  Vitals:   05/22/22 1028  TempSrc:   PainSc: 0-No pain      Patients Stated Pain Goal: 4 (09/19/58 4585)  Complications: No notable events documented.

## 2022-05-22 NOTE — Anesthesia Postprocedure Evaluation (Signed)
Anesthesia Post Note  Patient: RAHEEL KUNKLE  Procedure(s) Performed: TOTAL KNEE ARTHROPLASTY (Left: Knee)     Patient location during evaluation: PACU Anesthesia Type: Regional, MAC and Spinal Level of consciousness: awake and alert and oriented Pain management: pain level controlled Vital Signs Assessment: post-procedure vital signs reviewed and stable Respiratory status: spontaneous breathing, nonlabored ventilation and respiratory function stable Cardiovascular status: blood pressure returned to baseline and stable Postop Assessment: no headache, no backache, spinal receding and no apparent nausea or vomiting Anesthetic complications: no   No notable events documented.  Last Vitals:  Vitals:   05/22/22 1500 05/22/22 1515  BP: 128/75 (!) 125/56  Pulse: (!) 56 (!) 59  Resp: 16 16  Temp:    SpO2: 95% 95%    Last Pain:  Vitals:   05/22/22 1515  TempSrc:   PainSc: 0-No pain                 Pervis Hocking

## 2022-05-22 NOTE — Discharge Instructions (Addendum)
Todd Arabian, MD Total Joint Specialist [facility] [place] [address], [address] [place], [place] [address] [phone]  TOTAL KNEE REPLACEMENT POSTOPERATIVE DIRECTIONS    Knee Rehabilitation, Guidelines Following Surgery  Results after knee surgery are often greatly improved when you follow the exercise, range of motion and muscle strengthening exercises prescribed by your doctor. Safety measures are also important to protect the knee from further injury. If any of these exercises cause you to have increased pain or swelling in your knee joint, decrease the amount until you are comfortable again and slowly increase them. If you have problems or questions, call your caregiver or physical therapist for advice.   BLOOD CLOT PREVENTION Take a 10 mg Xarelto once a day for three weeks following surgery. Then take an 81 mg Aspirin once a day for three weeks. Then discontinue Aspirin. You may resume your vitamins/supplements once you have discontinued the Xarelto. Do not take any NSAIDs (Advil, Aleve, Ibuprofen, Meloxicam, etc.) until you have discontinued the Xarelto.   HOME CARE INSTRUCTIONS  Remove items at home which could result in a fall. This includes throw rugs or furniture in walking pathways.  ICE to the affected knee as much as tolerated. Icing helps control swelling. If the swelling is well controlled you will be more comfortable and rehab easier. Continue to use ice on the knee for pain and swelling from surgery. You may notice swelling that will progress down to the foot and ankle. This is normal after surgery. Elevate the leg when you are not up walking on it.    Continue to use the breathing machine which will help keep your temperature down. It is common for your temperature to cycle up and down following surgery, especially at night when you are not up moving around and exerting yourself. The breathing machine keeps your lungs expanded and your temperature  down. Do not place pillow under the operative knee, focus on keeping the knee straight while resting  DIET You may resume your previous home diet once you are discharged from the hospital.  DRESSING / WOUND CARE / SHOWERING Keep your bulky bandage on for 2 days. On the third post-operative day you may remove the Ace bandage and gauze. There is a waterproof adhesive bandage on your skin which will stay in place until your first follow-up appointment. Once you remove this you will not need to place another bandage You may begin showering 3 days following surgery, but do not submerge the incision under water.  ACTIVITY For the first 5 days, the key is rest and control of pain and swelling Do your home exercises twice a day starting on post-operative day 3. On the days you go to physical therapy, just do the home exercises once that day. You should rest, ice and elevate the leg for 50 minutes out of every hour. Get up and walk/stretch for 10 minutes per hour. After 5 days you can increase your activity slowly as tolerated. Walk with your walker as instructed. Use the walker until you are comfortable transitioning to a cane. Walk with the cane in the opposite hand of the operative leg. You may discontinue the cane once you are comfortable and walking steadily. Avoid periods of inactivity such as sitting longer than an hour when not asleep. This helps prevent blood clots.  You may discontinue the knee immobilizer once you are able to perform a straight leg raise while lying down. You may resume a sexual relationship in one month or  when given the OK by your doctor.  You may return to work once you are cleared by your doctor.  Do not drive a car for 6 weeks or until released by your surgeon.  Do not drive while taking narcotics.  TED HOSE STOCKINGS Wear the elastic stockings on both legs for three weeks following surgery during the day. You may remove them at night for sleeping.  WEIGHT  BEARING Weight bearing as tolerated with assist device (walker, cane, etc) as directed, use it as long as suggested by your surgeon or therapist, typically at least 4-6 weeks.  POSTOPERATIVE CONSTIPATION PROTOCOL Constipation - defined medically as fewer than three stools per week and severe constipation as less than one stool per week.  One of the most common issues patients have following surgery is constipation.  Even if you have a regular bowel pattern at home, your normal regimen is likely to be disrupted due to multiple reasons following surgery.  Combination of anesthesia, postoperative narcotics, change in appetite and fluid intake all can affect your bowels.  In order to avoid complications following surgery, here are some recommendations in order to help you during your recovery period.  Colace (docusate) - Pick up an over-the-counter form of Colace or another stool softener and take twice a day as long as you are requiring postoperative pain medications.  Take with a full glass of water daily.  If you experience loose stools or diarrhea, hold the colace until you stool forms back up. If your symptoms do not get better within 1 week or if they get worse, check with your doctor. Dulcolax (bisacodyl) - Pick up over-the-counter and take as directed by the product packaging as needed to assist with the movement of your bowels.  Take with a full glass of water.  Use this product as needed if not relieved by Colace only.  MiraLax (polyethylene glycol) - Pick up over-the-counter to have on hand. MiraLax is a solution that will increase the amount of water in your bowels to assist with bowel movements.  Take as directed and can mix with a glass of water, juice, soda, coffee, or tea. Take if you go more than two days without a movement. Do not use MiraLax more than once per day. Call your doctor if you are still constipated or irregular after using this medication for 7 days in a row.  If you continue  to have problems with postoperative constipation, please contact the office for further assistance and recommendations.  If you experience "the worst abdominal pain ever" or develop nausea or vomiting, please contact the office immediatly for further recommendations for treatment.  ITCHING If you experience itching with your medications, try taking only a single pain pill, or even half a pain pill at a time.  You can also use Benadryl over the counter for itching or also to help with sleep.   MEDICATIONS See your medication summary on the "After Visit Summary" that the nursing staff will review with you prior to discharge.  You may have some home medications which will be placed on hold until you complete the course of blood thinner medication.  It is important for you to complete the blood thinner medication as prescribed by your surgeon.  Continue your approved medications as instructed at time of discharge.  PRECAUTIONS If you experience chest pain or shortness of breath - call 911 immediately for transfer to the hospital emergency department.  If you develop a fever greater that 101 F, purulent  drainage from wound, increased redness or drainage from wound, foul odor from the wound/dressing, or calf pain - CONTACT YOUR SURGEON.                                                   FOLLOW-UP APPOINTMENTS Make sure you keep all of your appointments after your operation with your surgeon and caregivers. You should call the office at the above phone number and make an appointment for approximately two weeks after the date of your surgery or on the date instructed by your surgeon outlined in the "After Visit Summary".  RANGE OF MOTION AND STRENGTHENING EXERCISES  Rehabilitation of the knee is important following a knee injury or an operation. After just a few days of immobilization, the muscles of the thigh which control the knee become weakened and shrink (atrophy). Knee exercises are designed to build up  the tone and strength of the thigh muscles and to improve knee motion. Often times heat used for twenty to thirty minutes before working out will loosen up your tissues and help with improving the range of motion but do not use heat for the first two weeks following surgery. These exercises can be done on a training (exercise) mat, on the floor, on a table or on a bed. Use what ever works the best and is most comfortable for you Knee exercises include:  Leg Lifts - While your knee is still immobilized in a splint or cast, you can do straight leg raises. Lift the leg to 60 degrees, hold for 3 sec, and slowly lower the leg. Repeat 10-20 times 2-3 times daily. Perform this exercise against resistance later as your knee gets better.  Quad and Hamstring Sets - Tighten up the muscle on the front of the thigh (Quad) and hold for 5-10 sec. Repeat this 10-20 times hourly. Hamstring sets are done by pushing the foot backward against an object and holding for 5-10 sec. Repeat as with quad sets.  Leg Slides: Lying on your back, slowly slide your foot toward your buttocks, bending your knee up off the floor (only go as far as is comfortable). Then slowly slide your foot back down until your leg is flat on the floor again. Angel Wings: Lying on your back spread your legs to the side as far apart as you can without causing discomfort.  A rehabilitation program following serious knee injuries can speed recovery and prevent re-injury in the future due to weakened muscles. Contact your doctor or a physical therapist for more information on knee rehabilitation.   POST-OPERATIVE OPIOID TAPER INSTRUCTIONS: It is important to wean off of your opioid medication as soon as possible. If you do not need pain medication after your surgery it is ok to stop day one. Opioids include: Codeine, Hydrocodone(Norco, Vicodin), Oxycodone(Percocet, oxycontin) and hydromorphone amongst others.  Long term and even short term use of opiods can  cause: Increased pain response Dependence Constipation Depression Respiratory depression And more.  Withdrawal symptoms can include Flu like symptoms Nausea, vomiting And more Techniques to manage these symptoms Hydrate well Eat regular healthy meals Stay active Use relaxation techniques(deep breathing, meditating, yoga) Do Not substitute Alcohol to help with tapering If you have been on opioids for less than two weeks and do not have pain than it is ok to stop all together.  Plan to   wean off of opioids This plan should start within one week post op of your joint replacement. Maintain the same interval or time between taking each dose and first decrease the dose.  Cut the total daily intake of opioids by one tablet each day Next start to increase the time between doses. The last dose that should be eliminated is the evening dose.   IF YOU ARE TRANSFERRED TO A SKILLED REHAB FACILITY If the patient is transferred to a skilled rehab facility following release from the hospital, a list of the current medications will be sent to the facility for the patient to continue.  When discharged from the skilled rehab facility, please have the facility set up the patient's Home Health Physical Therapy prior to being released. Also, the skilled facility will be responsible for providing the patient with their medications at time of release from the facility to include their pain medication, the muscle relaxants, and their blood thinner medication. If the patient is still at the rehab facility at time of the two week follow up appointment, the skilled rehab facility will also need to assist the patient in arranging follow up appointment in our office and any transportation needs.  MAKE SURE YOU:  Understand these instructions.  Get help right away if you are not doing well or get worse.   DENTAL ANTIBIOTICS:  In most cases prophylactic antibiotics for Dental procdeures after total joint surgery are  not necessary.  Exceptions are as follows:  1. History of prior total joint infection  2. Severely immunocompromised (Organ Transplant, cancer chemotherapy, Rheumatoid biologic medications such as Humera)  3. Poorly controlled diabetes (A1C &gt; 8.0, blood glucose over 200)  If you have one of these conditions, contact your surgeon for an antibiotic prescription, prior to your dental procedure.    Pick up stool softner and laxative for home use following surgery while on pain medications. Do not submerge incision under water. Please use good hand washing techniques while changing dressing each day. May shower starting three days after surgery. Please use a clean towel to pat the incision dry following showers. Continue to use ice for pain and swelling after surgery. Do not use any lotions or creams on the incision until instructed by your surgeon.  Information on my medicine - XARELTO (Rivaroxaban)  This medication education was reviewed with me or my healthcare representative as part of my discharge preparation.  The pharmacist that spoke with me during my hospital stay was:    Why was Xarelto prescribed for you? Xarelto was prescribed for you to reduce the risk of blood clots forming after orthopedic surgery. The medical term for these abnormal blood clots is venous thromboembolism (VTE).  What do you need to know about xarelto ? Take your Xarelto ONCE DAILY at the same time every day. You may take it either with or without food.  If you have difficulty swallowing the tablet whole, you may crush it and mix in applesauce just prior to taking your dose.  Take Xarelto exactly as prescribed by your doctor and DO NOT stop taking Xarelto without talking to the doctor who prescribed the medication.  Stopping without other VTE prevention medication to take the place of Xarelto may increase your risk of developing a clot.  After discharge, you should have regular check-up  appointments with your healthcare provider that is prescribing your Xarelto.    What do you do if you miss a dose? If you miss a dose, take it as soon   as you remember on the same day then continue your regularly scheduled once daily regimen the next day. Do not take two doses of Xarelto on the same day.   Important Safety Information A possible side effect of Xarelto is bleeding. You should call your healthcare provider right away if you experience any of the following: Bleeding from an injury or your nose that does not stop. Unusual colored urine (red or dark brown) or unusual colored stools (red or black). Unusual bruising for unknown reasons. A serious fall or if you hit your head (even if there is no bleeding).  Some medicines may interact with Xarelto and might increase your risk of bleeding while on Xarelto. To help avoid this, consult your healthcare provider or pharmacist prior to using any new prescription or non-prescription medications, including herbals, vitamins, non-steroidal anti-inflammatory drugs (NSAIDs) and supplements.  This website has more information on Xarelto: www.xarelto.com.    

## 2022-05-22 NOTE — Anesthesia Procedure Notes (Signed)
Spinal  Patient location during procedure: OR Start time: 05/22/2022 12:45 PM End time: 05/22/2022 12:49 PM Reason for block: surgical anesthesia Staffing Performed: anesthesiologist  Anesthesiologist: Pervis Hocking, DO Performed by: Pervis Hocking, DO Authorized by: Pervis Hocking, DO   Preanesthetic Checklist Completed: patient identified, IV checked, risks and benefits discussed, surgical consent, monitors and equipment checked, pre-op evaluation and timeout performed Spinal Block Patient position: sitting Prep: DuraPrep and site prepped and draped Patient monitoring: cardiac monitor, continuous pulse ox and blood pressure Approach: midline Location: L3-4 Injection technique: single-shot Needle Needle type: Pencan  Needle gauge: 24 G Needle length: 9 cm Assessment Sensory level: T6 Events: CSF return Additional Notes Functioning IV was confirmed and monitors were applied. Sterile prep and drape, including hand hygiene and sterile gloves were used. The patient was positioned and the spine was prepped. The skin was anesthetized with lidocaine.  Free flow of clear CSF was obtained prior to injecting local anesthetic into the CSF.  The spinal needle aspirated freely following injection.  The needle was carefully withdrawn.  The patient tolerated the procedure well.

## 2022-05-22 NOTE — Progress Notes (Signed)
AssistedDr. Criss Rosales with left, adductor canal block. Side rails up, monitors on throughout procedure. See vital signs in flow sheet. Tolerated Procedure well.

## 2022-05-23 DIAGNOSIS — M1712 Unilateral primary osteoarthritis, left knee: Secondary | ICD-10-CM | POA: Diagnosis not present

## 2022-05-23 LAB — CBC
HCT: 34.9 % — ABNORMAL LOW (ref 39.0–52.0)
Hemoglobin: 12.2 g/dL — ABNORMAL LOW (ref 13.0–17.0)
MCH: 31.1 pg (ref 26.0–34.0)
MCHC: 35 g/dL (ref 30.0–36.0)
MCV: 89 fL (ref 80.0–100.0)
Platelets: 174 10*3/uL (ref 150–400)
RBC: 3.92 MIL/uL — ABNORMAL LOW (ref 4.22–5.81)
RDW: 12.5 % (ref 11.5–15.5)
WBC: 12.3 10*3/uL — ABNORMAL HIGH (ref 4.0–10.5)
nRBC: 0 % (ref 0.0–0.2)

## 2022-05-23 LAB — GLUCOSE, CAPILLARY
Glucose-Capillary: 155 mg/dL — ABNORMAL HIGH (ref 70–99)
Glucose-Capillary: 149 mg/dL — ABNORMAL HIGH (ref 70–99)

## 2022-05-23 LAB — BASIC METABOLIC PANEL
Anion gap: 8 (ref 5–15)
BUN: 18 mg/dL (ref 8–23)
CO2: 22 mmol/L (ref 22–32)
Calcium: 8.6 mg/dL — ABNORMAL LOW (ref 8.9–10.3)
Chloride: 108 mmol/L (ref 98–111)
Creatinine, Ser: 0.87 mg/dL (ref 0.61–1.24)
GFR, Estimated: >60 mL/min (ref >60)
Glucose, Bld: 181 mg/dL — ABNORMAL HIGH (ref 70–99)
Potassium: 4.3 mmol/L (ref 3.5–5.1)
Sodium: 138 mmol/L (ref 135–145)

## 2022-05-23 MED ORDER — TRAMADOL HCL 50 MG PO TABS
50.0000 mg | ORAL_TABLET | Freq: Four times a day (QID) | ORAL | 0 refills | Status: DC | PRN
Start: 2022-05-23 — End: 2022-12-19

## 2022-05-23 MED ORDER — RIVAROXABAN 10 MG PO TABS: 10.0000 mg | ORAL_TABLET | Freq: Every day | ORAL | 0 refills | Status: DC

## 2022-05-23 MED ORDER — METHOCARBAMOL 500 MG PO TABS: 500.0000 mg | ORAL_TABLET | Freq: Four times a day (QID) | ORAL | 0 refills | Status: AC | PRN

## 2022-05-23 MED ORDER — OXYCODONE HCL 5 MG PO TABS: 5.0000 mg | ORAL_TABLET | Freq: Four times a day (QID) | ORAL | 0 refills | Status: DC | PRN

## 2022-05-23 NOTE — Evaluation (Addendum)
Physical Therapy Evaluation Patient Details Name: Todd Roy MRN: [id number] DOB: 1946/04/24 Today's Date: 05/23/2022  History of Present Illness  76 yo male s/p L TKA 05/23/22  Clinical Impression  On eval, pt was Min guard assist for mobility. He walked ~115 feet with a RW. Moderate pain with activity. Reviewed/practiced exercises, gait training, and stair training. Encouraged pt to perform straight legs raises and quad sets in addition to frequent ambulation. Pt is eager to d/c home. All education completed.        Recommendations for follow up therapy are one component of a multi-disciplinary discharge planning process, led by the attending physician.  Recommendations may be updated based on patient status, additional functional criteria and insurance authorization.  Follow Up Recommendations Follow physician's recommendations for discharge plan and follow up therapies      Assistance Recommended at Discharge PRN  Patient can return home with the following  A little help with walking and/or transfers;A little help with bathing/dressing/bathroom;Assistance with cooking/housework;Assist for transportation;Help with stairs or ramp for entrance    Equipment Recommendations Rolling walker (2 wheels)  Recommendations for Other Services       Functional Status Assessment Patient has had a recent decline in their functional status and demonstrates the ability to make significant improvements in function in a reasonable and predictable amount of time.     Precautions / Restrictions Precautions Precautions: Fall;Knee Restrictions Weight Bearing Restrictions: No LLE Weight Bearing: Weight bearing as tolerated      Mobility  Bed Mobility Overal bed mobility: Needs Assistance Bed Mobility: Supine to Sit     Supine to sit: Supervision          Transfers Overall transfer level: Needs assistance Equipment used: Rolling walker (2 wheels) Transfers: Sit to/from Stand Sit to  Stand: Min guard, From elevated surface           General transfer comment: Min guard for safety. Cues for safety, technique, hand placement    Ambulation/Gait Ambulation/Gait assistance: Min guard Gait Distance (Feet): 115 Feet Assistive device: Rolling walker (2 wheels) Gait Pattern/deviations: Step-to pattern, Decreased stance time - left, Decreased weight shift to left       General Gait Details: Min guard for safety. Cues for safety, technique, sequence, RW proximity.  Stairs Stairs: Yes Stairs assistance: Min guard Stair Management: Step to pattern, Forwards, Two rails Number of Stairs: 2 General stair comments: up and over portable stairs. cues for safety, technique, sequence "up with good/strong leg first, down with bad/surgical leg first"  Wheelchair Mobility    Modified Rankin (Stroke Patients Only)       Balance Overall balance assessment: Needs assistance         Standing balance support: Bilateral upper extremity supported, During functional activity, Reliant on assistive device for balance Standing balance-Leahy Scale: Fair                               Pertinent Vitals/Pain Pain Assessment Pain Assessment: 0-10 Pain Score: 4  Pain Location: L knee Pain Descriptors / Indicators: Discomfort, Sore Pain Intervention(s): Monitored during session, Repositioned, Ice applied    Home Living Family/patient expects to be discharged to:: Private residence Living Arrangements: Alone Available Help at Discharge: Friend(s) Type of Home: House Home Access: Stairs to enter Entrance Stairs-Rails: Chemical engineer of Steps: 3   Home Layout: One level;Able to live on main level with bedroom/bathroom Home Equipment: None  Prior Function Prior Level of Function : Independent/Modified Independent                     Hand Dominance        Extremity/Trunk Assessment   Upper Extremity Assessment Upper Extremity  Assessment: Overall WFL for tasks assessed    Lower Extremity Assessment Lower Extremity Assessment: Generalized weakness    Cervical / Trunk Assessment Cervical / Trunk Assessment: Normal  Communication   Communication: No difficulties  Cognition Arousal/Alertness: Awake/alert Behavior During Therapy: WFL for tasks assessed/performed Overall Cognitive Status: Within Functional Limits for tasks assessed                                          General Comments      Exercises Total Joint Exercises Ankle Circles/Pumps: AROM, Both, 10 reps Quad Sets: AROM, Both, 10 reps Long Arc Quad: AROM, Left, Seated (3) Knee Flexion: AAROM, Left, 10 reps, Seated Goniometric ROM: ~10-80 degrees   Assessment/Plan    PT Assessment Patient needs continued PT services  PT Problem List Decreased strength;Decreased balance;Decreased range of motion;Decreased activity tolerance;Pain;Decreased mobility;Decreased knowledge of use of DME       PT Treatment Interventions DME instruction;Gait training;Functional mobility training;Therapeutic activities;Balance training;Patient/family education;Stair training;Therapeutic exercise    PT Goals (Current goals can be found in the Care Plan section)  Acute Rehab PT Goals Patient Stated Goal: regain PLOF/independence PT Goal Formulation: With patient Time For Goal Achievement: 06/06/22 Potential to Achieve Goals: Good    Frequency       Co-evaluation               AM-PAC PT "6 Clicks" Mobility  Outcome Measure Help needed turning from your back to your side while in a flat bed without using bedrails?: None Help needed moving from lying on your back to sitting on the side of a flat bed without using bedrails?: None Help needed moving to and from a bed to a chair (including a wheelchair)?: A Little Help needed standing up from a chair using your arms (e.g., wheelchair or bedside chair)?: A Little Help needed to walk in  hospital room?: A Little Help needed climbing 3-5 steps with a railing? : A Little 6 Click Score: 20    End of Session Equipment Utilized During Treatment: Gait belt Activity Tolerance: Patient tolerated treatment well Patient left: in chair;with call bell/phone within reach;with family/visitor present   PT Visit Diagnosis: Pain;Other abnormalities of gait and mobility (R26.89) Pain - Right/Left: Left Pain - part of body: Knee    Time: 7209-4709 PT Time Calculation (min) (ACUTE ONLY): 23 min   Charges:   PT Evaluation $PT Eval Low Complexity: 1 Low PT Treatments $Gait Training: 8-22 mins          Doreatha Massed, PT Acute Rehabilitation  Office: [phone] Pager: [phone]

## 2022-05-23 NOTE — TOC Transition Note (Signed)
Transition of Care [facility]) - CM/SW Discharge Note   Patient Details  Name: Todd Roy MRN: [id number] Date of Birth: [birth year]  Transition of Care [facility]) CM/SW Contact:  Lennart Pall, LCSW Phone Number: 05/23/2022, 10:03 AM   Clinical Narrative:     Met wit pt and confirming receipt of rolling walker via [employer].  OPPT set up for [facility]. No further TOC needs.  Final next level of care: OP Rehab Barriers to Discharge: No Barriers Identified   Patient Goals and CMS Choice Patient states their goals for this hospitalization and ongoing recovery are:: return home      Discharge Placement                       Discharge Plan and Services                DME Arranged: Walker rolling DME Agency: [place]                  Social Determinants of Health (SDOH) Interventions     Readmission Risk Interventions     No data to display

## 2022-05-23 NOTE — Plan of Care (Signed)
Pt discharged home with friend.  [place] teaching done. Written information given.

## 2022-05-23 NOTE — Progress Notes (Signed)
   Subjective: 1 Day Post-Op Procedure(s) (LRB): TOTAL KNEE ARTHROPLASTY (Left) Patient reports pain as mild.   We will start therapy today.  Plan is to go Home after hospital stay.  Objective: Vital signs in last 24 hours: Temp:  [97.5 F (36.4 C)-98.1 F (36.7 C)] 97.8 F (36.6 C) (07/04 0531) Pulse Rate:  [37-78] 63 (07/04 0531) Resp:  [6-21] 18 (07/04 0531) BP: (122-158)/(56-94) 124/76 (07/04 0531) SpO2:  [94 %-100 %] 97 % (07/04 0531) Weight:  [98.9 kg] 98.9 kg (07/03 1028)  Intake/Output from previous day:  Intake/Output Summary (Last 24 hours) at 05/23/2022 0913 Last data filed at 05/23/2022 0531 Gross per 24 hour  Intake 2320.11 ml  Output 3775 ml  Net -1454.89 ml    Intake/Output this shift: No intake/output data recorded.  Labs: Recent Labs    05/23/22 0338  HGB 12.2*   Recent Labs    05/23/22 0338  WBC 12.3*  RBC 3.92*  HCT 34.9*  PLT 174   Recent Labs    05/23/22 0338  NA 138  K 4.3  CL 108  CO2 22  BUN 18  CREATININE 0.87  GLUCOSE 181*  CALCIUM 8.6*   No results for input(s): "LABPT", "INR" in the last 72 hours.  EXAM General - Patient is Alert, Appropriate, and Oriented Extremity - Neurologically intact Neurovascular intact No cellulitis present Compartment soft Dressing - dressing C/D/I Motor Function - intact, moving foot and toes well on exam.   Past Medical History:  Diagnosis Date   Arthritis    Cancer ([place])    thyroid cancer   Diabetes mellitus without complication ([place])    Hypertension    Hypothyroidism     Assessment/Plan: 1 Day Post-Op Procedure(s) (LRB): TOTAL KNEE ARTHROPLASTY (Left) Principal Problem:   OA (osteoarthritis) of knee Active Problems:   Osteoarthritis of left knee   Advance diet Up with therapy D/C IV fluids Discharge home if he does well with PT  DVT Prophylaxis - Xarelto Weight-Bearing as tolerated to left leg  Pilar Plate Shaneil Yazdi 05/23/2022, 9:13 AM

## 2022-05-24 NOTE — Discharge Summary (Signed)
Physician Discharge Summary   Patient ID: LEODAN BOLYARD MRN: [id number] DOB/AGE: 1946-02-06 76 y.o.  Admit date: 05/22/2022 Discharge date: 05/23/2022  Primary Diagnosis: Osteoarthritis, left knee   Admission Diagnoses:  Past Medical History:  Diagnosis Date   Arthritis    Cancer ([place])    thyroid cancer   Diabetes mellitus without complication ([place])    Hypertension    Hypothyroidism    Discharge Diagnoses:   Principal Problem:   OA (osteoarthritis) of knee Active Problems:   Osteoarthritis of left knee  Estimated body mass index is 30.4 kg/m as calculated from the following:   Height as of this encounter: '5\' 11"'$  (1.803 m).   Weight as of this encounter: 98.9 kg.  Procedure:  Procedure(s) (LRB): TOTAL KNEE ARTHROPLASTY (Left)   Consults: None  HPI: ASSER LUCENA is a 76 y.o. year old male with end stage OA of his left knee with progressively worsening pain and dysfunction. He has constant pain, with activity and at rest and significant functional deficits with difficulties even with ADLs. He has had extensive non-op management including analgesics, injections of cortisone and viscosupplements, and home exercise program, but remains in significant pain with significant dysfunction. Radiographs show bone on bone arthritis medial and patellofemoral. He presents now for left Total Knee Arthroplasty.   Laboratory Data: Admission on 05/22/2022, Discharged on 05/23/2022  Component Date Value Ref Range Status   Glucose-Capillary 05/22/2022 168 (H)  70 - 99 mg/dL Final   Glucose reference range applies only to samples taken after fasting for at least 8 hours.   Comment 1 05/22/2022 Notify RN   Final   Comment 2 05/22/2022 Document in Chart   Final   Glucose-Capillary 05/22/2022 159 (H)  70 - 99 mg/dL Final   Glucose reference range applies only to samples taken after fasting for at least 8 hours.   WBC 05/23/2022 12.3 (H)  4.0 - 10.5 K/uL Final   RBC 05/23/2022 3.92 (L)  4.22  - 5.81 MIL/uL Final   Hemoglobin 05/23/2022 12.2 (L)  13.0 - 17.0 g/dL Final   HCT 05/23/2022 34.9 (L)  39.0 - 52.0 % Final   MCV 05/23/2022 89.0  80.0 - 100.0 fL Final   MCH 05/23/2022 31.1  26.0 - 34.0 pg Final   MCHC 05/23/2022 35.0  30.0 - 36.0 g/dL Final   RDW 05/23/2022 12.5  11.5 - 15.5 % Final   Platelets 05/23/2022 174  150 - 400 K/uL Final   nRBC 05/23/2022 0.0  0.0 - 0.2 % Final   Performed at [facility], [place] [address], [place], [place] [address]   Sodium 05/23/2022 138  135 - 145 mmol/L Final   Potassium 05/23/2022 4.3  3.5 - 5.1 mmol/L Final   Chloride 05/23/2022 108  98 - 111 mmol/L Final   CO2 05/23/2022 22  22 - 32 mmol/L Final   Glucose, Bld 05/23/2022 181 (H)  70 - 99 mg/dL Final   Glucose reference range applies only to samples taken after fasting for at least 8 hours.   BUN 05/23/2022 18  8 - 23 mg/dL Final   Creatinine, Ser 05/23/2022 0.87  0.61 - 1.24 mg/dL Final   Calcium 05/23/2022 8.6 (L)  8.9 - 10.3 mg/dL Final   GFR, Estimated 05/23/2022 >60  >60 mL/min Final   Comment: (NOTE) Calculated using the CKD-EPI Creatinine Equation (2021)    Anion gap 05/23/2022 8  5 - 15 Final   Performed at [facility], [address] [place], [place] [address]   Glucose-Capillary 05/22/2022 235 (H)  70 - 99 mg/dL Final   Glucose reference range applies only to samples taken after fasting for at least 8 hours.   Glucose-Capillary 05/22/2022 239 (H)  70 - 99 mg/dL Final   Glucose reference range applies only to samples taken after fasting for at least 8 hours.   Glucose-Capillary 05/23/2022 155 (H)  70 - 99 mg/dL Final   Glucose reference range applies only to samples taken after fasting for at least 8 hours.   Glucose-Capillary 05/23/2022 149 (H)  70 - 99 mg/dL Final   Glucose reference range applies only to samples taken after fasting for at least 8 hours.  Hospital Outpatient Visit on 05/09/2022  Component Date Value Ref Range Status    MRSA, PCR 05/09/2022 NEGATIVE  NEGATIVE Final   Staphylococcus aureus 05/09/2022 NEGATIVE  NEGATIVE Final   Comment: (NOTE) The Xpert SA Assay (FDA approved for NASAL specimens in patients 39 years of age and older), is one component of a comprehensive surveillance program. It is not intended to diagnose infection nor to guide or monitor treatment. Performed at [facility], [place] [address], [place], [place] [address]    Sodium 05/09/2022 139  135 - 145 mmol/L Final   Potassium 05/09/2022 4.2  3.5 - 5.1 mmol/L Final   Chloride 05/09/2022 106  98 - 111 mmol/L Final   CO2 05/09/2022 24  22 - 32 mmol/L Final   Glucose, Bld 05/09/2022 122 (H)  70 - 99 mg/dL Final   Glucose reference range applies only to samples taken after fasting for at least 8 hours.   BUN 05/09/2022 29 (H)  8 - 23 mg/dL Final   Creatinine, Ser 05/09/2022 1.08  0.61 - 1.24 mg/dL Final   Calcium 05/09/2022 9.8  8.9 - 10.3 mg/dL Final   GFR, Estimated 05/09/2022 >60  >60 mL/min Final   Comment: (NOTE) Calculated using the CKD-EPI Creatinine Equation (2021)    Anion gap 05/09/2022 9  5 - 15 Final   Performed at [facility], [place] [address] [place], [place] [address]   WBC 05/09/2022 7.7  4.0 - 10.5 K/uL Final   RBC 05/09/2022 4.55  4.22 - 5.81 MIL/uL Final   Hemoglobin 05/09/2022 14.3  13.0 - 17.0 g/dL Final   HCT 05/09/2022 41.4  39.0 - 52.0 % Final   MCV 05/09/2022 91.0  80.0 - 100.0 fL Final   MCH 05/09/2022 31.4  26.0 - 34.0 pg Final   MCHC 05/09/2022 34.5  30.0 - 36.0 g/dL Final   RDW 05/09/2022 12.8  11.5 - 15.5 % Final   Platelets 05/09/2022 216  150 - 400 K/uL Final   nRBC 05/09/2022 0.0  0.0 - 0.2 % Final   Performed at [facility], [place] [address] [place], [place] [address]   Hgb A1c MFr Bld 05/09/2022 5.8 (H)  4.8 - 5.6 % Final   Comment: (NOTE) Pre diabetes:          5.7%-6.4%  Diabetes:              >6.4%  Glycemic control for    <7.0% adults with diabetes    Mean Plasma Glucose 05/09/2022 119.76  mg/dL Final   Performed at [facility], [place] [address] [place], [place] [address]   Glucose-Capillary 05/09/2022 114 (H)  70 - 99 mg/dL Final   Glucose reference range applies only to samples taken after fasting  for at least 8 hours.     X-Rays:No results found.  EKG: Orders placed or performed during the hospital encounter of 05/09/22   EKG 12 lead per protocol   EKG 12 lead per protocol     Hospital Course: EWART CARRERA is a 76 y.o. who was admitted to [facility]. They were brought to the operating room on 05/22/2022 and underwent Procedure(s): TOTAL KNEE ARTHROPLASTY.  Patient tolerated the procedure well and was later transferred to the recovery room and then to the orthopaedic floor for postoperative care. They were given PO and IV analgesics for pain control following their surgery. They were given 24 hours of postoperative antibiotics of  Anti-infectives (From admission, onward)    Start     Dose/Rate Route Frequency Ordered Stop   05/22/22 2000  ceFAZolin (ANCEF) IVPB 2g/100 mL premix        2 g 200 mL/hr over 30 Minutes Intravenous Every 6 hours 05/22/22 1630 05/23/22 1236   05/22/22 1015  ceFAZolin (ANCEF) IVPB 2g/100 mL premix        2 g 200 mL/hr over 30 Minutes Intravenous On call to O.R. 05/22/22 1003 05/22/22 1255      and started on DVT prophylaxis in the form of Xarelto.   PT and OT were ordered for total joint protocol. Discharge planning consulted to help with postop disposition and equipment needs.  Patient had a good night on the evening of surgery. They started to get up OOB with therapy on POD #1 and was meeting his goals. Pt was discharged to home later that day in stable condition.  Diet: Carb modified diet Activity: WBAT Follow-up: in 2 weeks Disposition: Home Discharged Condition: stable   Discharge Instructions     Call MD / Call 911   Complete by: As  directed    If you experience chest pain or shortness of breath, CALL 911 and be transported to the hospital emergency room.  If you develope a fever above 101 F, pus (white drainage) or increased drainage or redness at the wound, or calf pain, call your surgeon's office.   Change dressing   Complete by: As directed    You may remove the bulky bandage (ACE wrap and gauze) two days after surgery. You will have an adhesive waterproof bandage underneath. Leave this in place until your first follow-up appointment.   Constipation Prevention   Complete by: As directed    Drink plenty of fluids.  Prune juice may be helpful.  You may use a stool softener, such as Colace (over the counter) 100 mg twice a day.  Use MiraLax (over the counter) for constipation as needed.   Diet - low sodium heart healthy   Complete by: As directed    Do not put a pillow under the knee. Place it under the heel.   Complete by: As directed    Driving restrictions   Complete by: As directed    No driving for two weeks   Post-operative opioid taper instructions:   Complete by: As directed    POST-OPERATIVE OPIOID TAPER INSTRUCTIONS: It is important to wean off of your opioid medication as soon as possible. If you do not need pain medication after your surgery it is ok to stop day one. Opioids include: Codeine, Hydrocodone(Norco, Vicodin), Oxycodone(Percocet, oxycontin) and hydromorphone amongst others.  Long term and even short term use of opiods can cause: Increased pain response Dependence Constipation Depression Respiratory depression And more.  Withdrawal symptoms can  include Flu like symptoms Nausea, vomiting And more Techniques to manage these symptoms Hydrate well Eat regular healthy meals Stay active Use relaxation techniques(deep breathing, meditating, yoga) Do Not substitute Alcohol to help with tapering If you have been on opioids for less than two weeks and do not have pain than it is ok to stop all  together.  Plan to wean off of opioids This plan should start within one week post op of your joint replacement. Maintain the same interval or time between taking each dose and first decrease the dose.  Cut the total daily intake of opioids by one tablet each day Next start to increase the time between doses. The last dose that should be eliminated is the evening dose.      TED hose   Complete by: As directed    Use stockings (TED hose) for three weeks on both leg(s).  You may remove them at night for sleeping.   Weight bearing as tolerated   Complete by: As directed       Allergies as of 05/23/2022       Reactions   Sulfonamide Derivatives    Unknown Reaction was told as a child        Medication List     STOP taking these medications    Hyzaar 100-25 MG tablet Generic drug: losartan-hydrochlorothiazide   pravastatin 20 MG tablet Commonly known as: Pravachol   Xanax 0.25 MG tablet Generic drug: ALPRAZolam       TAKE these medications    carvedilol 6.25 MG tablet Commonly known as: COREG Take 6.25 mg by mouth 2 (two) times daily with a meal.   hydrochlorothiazide 12.5 MG tablet Commonly known as: HYDRODIURIL Take 12.5 mg by mouth daily.   losartan 100 MG tablet Commonly known as: COZAAR Take 100 mg by mouth daily.   metFORMIN 500 MG tablet Commonly known as: GLUCOPHAGE Take 500-1,000 mg by mouth See admin instructions. Take 1000 mg by mouth in the morning and 500 mg in the evening What changed: Another medication with the same name was removed. Continue taking this medication, and follow the directions you see here.   methocarbamol 500 MG tablet Commonly known as: ROBAXIN Take 1 tablet (500 mg total) by mouth every 6 (six) hours as needed for muscle spasms.   oxyCODONE 5 MG immediate release tablet Commonly known as: Oxy IR/ROXICODONE Take 1-2 tablets (5-10 mg total) by mouth every 6 (six) hours as needed for severe pain.   rivaroxaban 10 MG Tabs  tablet Commonly known as: XARELTO Take 1 tablet (10 mg total) by mouth daily with breakfast for 20 days.   rosuvastatin 20 MG tablet Commonly known as: CRESTOR Take 20 mg by mouth daily.   Synthroid 200 MCG tablet Generic drug: levothyroxine Take 200 mcg by mouth daily before breakfast. What changed: Another medication with the same name was removed. Continue taking this medication, and follow the directions you see here.   tadalafil 10 MG tablet Commonly known as: CIALIS Take 10 mg by mouth daily.   traMADol 50 MG tablet Commonly known as: ULTRAM Take 1-2 tablets (50-100 mg total) by mouth every 6 (six) hours as needed for moderate pain.               Discharge Care Instructions  (From admission, onward)           Start     Ordered   05/23/22 0000  Weight bearing as tolerated  05/23/22 1003   05/23/22 0000  Change dressing       Comments: You may remove the bulky bandage (ACE wrap and gauze) two days after surgery. You will have an adhesive waterproof bandage underneath. Leave this in place until your first follow-up appointment.   05/23/22 1003            Follow-up Information     Gaynelle Arabian, MD. Schedule an appointment as soon as possible for a visit in 2 week(s).   Specialty: Orthopedic Surgery Contact information: [address] [place] [address] [phone]                 Signed: R. Jaynie Bream, PA-C Orthopedic Surgery 05/24/2022, 10:33 AM

## 2022-05-25 ENCOUNTER — Encounter (HOSPITAL_COMMUNITY): Payer: Self-pay | Admitting: Orthopedic Surgery

## 2022-11-28 NOTE — H&P (Signed)
TOTAL KNEE ADMISSION H&P  Patient is being admitted for right total knee arthroplasty.  Subjective:  Chief Complaint: Right knee pain.  HPI: Todd Roy, 77 y.o. male has a history of pain and functional disability in the right knee due to arthritis and has failed non-surgical conservative treatments for greater than 12 weeks to include corticosteriod injections and activity modification. Onset of symptoms was gradual, starting 5 years ago with gradually worsening course since that time. The patient noted no past surgery on the right knee.  Patient currently rates pain in the right knee at 7 out of 10 with activity. Patient has worsening of pain with activity and weight bearing and pain that interferes with activities of daily living. Patient has evidence of periarticular osteophytes and joint space narrowing by imaging studies.  There is no active infection.  Patient Active Problem List   Diagnosis Date Noted   OA (osteoarthritis) of knee 05/22/2022   Osteoarthritis of left knee 05/22/2022   ANXIETY STATE, UNSPECIFIED 04/26/2010   SMOKER 04/26/2010   COLONIC POLYPS, HX OF 04/26/2010   HYPERTENSION 08/26/2008   HYPERGLYCEMIA, FASTING 08/26/2008   NEOPLASM, MALIGNANT, THYROID GLAND, HX OF 08/26/2008    Past Medical History:  Diagnosis Date   Arthritis    Cancer ([place])    thyroid cancer   Diabetes mellitus without complication ([place])    Hypertension    Hypothyroidism     Past Surgical History:  Procedure Laterality Date   EYE MUSCLE SURGERY  3   "cross eyed"; OS esotropia   TOTAL KNEE ARTHROPLASTY Left 05/22/2022   Procedure: TOTAL KNEE ARTHROPLASTY;  Surgeon: Gaynelle Arabian, MD;  Location: [facility];  Service: Orthopedics;  Laterality: Left;   TOTAL THYROIDECTOMY     Dr Justice Rocher TOOTH EXTRACTION      Prior to Admission medications   Medication Sig Start Date End Date Taking? Authorizing Provider  carvedilol (COREG) 6.25 MG tablet Take 6.25 mg by mouth 2 (two) times  daily with a meal.    [provider]  hydrochlorothiazide (HYDRODIURIL) 12.5 MG tablet Take 12.5 mg by mouth daily. 01/28/22   [provider]  levothyroxine (SYNTHROID) 200 MCG tablet Take 200 mcg by mouth daily before breakfast. 05/03/22   [provider]  losartan (COZAAR) 100 MG tablet Take 100 mg by mouth daily. 01/28/22   [provider]  metFORMIN (GLUCOPHAGE) 500 MG tablet Take 500-1,000 mg by mouth See admin instructions. Take 1000 mg by mouth in the morning and 500 mg in the evening 01/28/22   [provider]  methocarbamol (ROBAXIN) 500 MG tablet Take 1 tablet (500 mg total) by mouth every 6 (six) hours as needed for muscle spasms. 05/23/22   Jearld Lesch, PA  oxyCODONE (OXY IR/ROXICODONE) 5 MG immediate release tablet Take 1-2 tablets (5-10 mg total) by mouth every 6 (six) hours as needed for severe pain. 05/23/22   Jearld Lesch, PA  rosuvastatin (CRESTOR) 20 MG tablet Take 20 mg by mouth daily. 01/28/22   [provider]  tadalafil (CIALIS) 10 MG tablet Take 10 mg by mouth daily. 11/01/21   [provider]  traMADol (ULTRAM) 50 MG tablet Take 1-2 tablets (50-100 mg total) by mouth every 6 (six) hours as needed for moderate pain. 05/23/22   Jearld Lesch, PA    Allergies  Allergen Reactions   Sulfonamide Derivatives     Unknown Reaction was told as a child    Social History   Socioeconomic History  Marital status: Single    Spouse name: Not on file   Number of children: Not on file   Years of education: Not on file   Highest education level: Not on file  Occupational History   Not on file  Tobacco Use   Smoking status: Every Day    Types: Cigars   Smokeless tobacco: Never   Tobacco comments:    2-3 cigars/ weekend  Vaping Use   Vaping Use: Never used  Substance and Sexual Activity   Alcohol use: Yes    Alcohol/week: 7.0 standard drinks of alcohol    Types: 7 Shots of liquor per week    Comment: 1-2  drinks dialy   Drug use: No   Sexual activity: Not on file  Other Topics Concern   Not on file  Social History Narrative   Not on file   Social Determinants of Health   Financial Resource Strain: Not on file  Food Insecurity: Not on file  Transportation Needs: Not on file  Physical Activity: Not on file  Stress: Not on file  Social Connections: Not on file  Intimate Partner Violence: Not on file    Tobacco Use: High Risk (05/25/2022)   Patient History    Smoking Tobacco Use: Every Day    Smokeless Tobacco Use: Never    Passive Exposure: Not on file   Social History   Substance and Sexual Activity  Alcohol Use Yes   Alcohol/week: 7.0 standard drinks of alcohol   Types: 7 Shots of liquor per week   Comment: 1-2 drinks dialy    Family History  Problem Relation Age of Onset   Cancer Mother        colon   Cancer Father        ? renal; metastatic to lung, cns    ROS  Objective:  Physical Exam: The patient is a pleasant, well-developed male alert and oriented in no apparent distress.    Bilateral Hip Exam:  The range of motion: normal without discomfort.    Right Knee Exam:  No effusion present. No swelling present.  The range of motion is: 5 to 120 degrees.  Moderate crepitus on range of motion of the knee.  No medial joint line tenderness.  No lateral joint line tenderness.  The knee is stable.  The patient's sensation and motor function are intact in their lower extremities. Their distal pulses are 2+. The bilateral calves are soft and non-tender.  RESULTS   Radiographs - AP and lateral of the bilateral knees dated 05/2021 demonstrate bone-on-bone arthritis in the medial and patellofemoral compartments of the bilateral knees. The arthritis is slightly worse radiographically in the right knee, but not a substantial difference.     Assessment/Plan:  End stage arthritis, right knee   The patient history, physical examination, clinical judgment of the  provider and imaging studies are consistent with end stage degenerative joint disease of the right knee and total knee arthroplasty is deemed medically necessary. The treatment options including medical management, injection therapy arthroscopy and arthroplasty were discussed at length. The risks and benefits of total knee arthroplasty were presented and reviewed. The risks due to aseptic loosening, infection, stiffness, patella tracking problems, thromboembolic complications and other imponderables were discussed. The patient acknowledged the explanation, agreed to proceed with the plan and consent was signed. Patient is being admitted for inpatient treatment for surgery, pain control, PT, OT, prophylactic antibiotics, VTE prophylaxis, progressive ambulation and ADLs and discharge planning. The patient is planning  to be discharged  home to undergo OPPT.   Patient's anticipated LOS is less than 2 midnights, meeting these requirements: - Younger than 29 - Lives within 1 hour of care - Has a competent adult at home to recover with post-op recover - NO history of  - Chronic pain requiring opiods  - Coronary Artery Disease  - Heart failure  - Heart attack  - Stroke  - DVT/VTE  - Cardiac arrhythmia  - Respiratory Failure/COPD  - Renal failure  - Anemia  - Advanced Liver disease    Therapy Plans: EO Disposition: Home with Friend Planned DVT Prophylaxis: Xarelto 10 mg (hx of thyroid cancer) DME Needed: None PCP: Max Vinograd, PA-C (clearance received) TXA: IV Allergies: sulfa (hives) Anesthesia Concerns: None BMI: 32.2 Last HgbA1c: 6.4 (11/07/22 - Epic care everywhere) [facility]: [facility] ([place])  Other: -oxycodone, tramadol, methocarbamol after L TKA (July 2023)  - Patient was instructed on what medications to stop prior to surgery. - Follow-up visit in 2 weeks with Dr. Wynelle Link - Begin physical therapy following surgery - Pre-operative lab work as pre-surgical testing -  Prescriptions will be provided in hospital at time of discharge  Shearon Balo, PA-C Orthopedic Surgery [facility] [place]

## 2022-12-07 NOTE — Progress Notes (Addendum)
COVID Vaccine received:  _0  No _1  Yes Date of any COVID positive Test in last 90 days: none  PCP - Max Vinogard PA-C  Clearance on chart dated 09-11-2022  Cardiologist -Elmer Sow, PA-C at [facility] Ph:  [phone]    Fax: [phone]  Chest x-ray -  2012 epic EKG - 05-09-22 epic  11-08-2022  CE (request fax talked to [facility]) Stress Test -  ECHO - 10-13-2011 epic Cardiac Cath -   PCR screen: _2  Ordered & Completed                      _3   No Order but Needs PROFEND                      _4   N/A for this surgery  Surgery Plan:  _5  Ambulatory                            _6  Outpatient in bed                            _7  Admit  Anesthesia:    _8  General  _9  Spinal                           _10   Choice _11   MAC   Pacemaker / ICD device _12  No _13  Yes        Device order form faxed _14  No    _15   Yes      Faxed to:  Spinal Cord Stimulator:_16  No _17  Yes      (Remind patient to bring remote DOS) Other Implants:   History of Sleep Apnea? _18  No _19  Yes   CPAP used?- _20  No _21  Yes    Patient has: _22  Pre-DM   _23  DM1  _24   DM2 Does the patient monitor blood sugar? _25  No _26  Yes  _27  N/A Does patient have a [employer] or Dexacom? _28  No _29  Yes   Fasting Blood Sugar Ranges-  Checks Blood Sugar _0  times a day  Diabetic medications/ instructions: Metformin 559m bid, Take usual dose the day before surgery. DO NOT take on the day of surgery.   Blood Thinner / Instructions: none Aspirin Instructions: None  ERAS Protocol Ordered: _30  No  _31  Yes PRE-SURGERY _32  ENSURE  _33  G2  Patient is to be NPO after: 06:30am  Activity level: Patient can not climb a flight of stairs without difficulty; _34  No CP  _35  No SOB, but would have leg pain.  Patient can perform ADLs without assistance.   Anesthesia review: HTN, PRe-DM, Hx Thyroid cancer, anxiety, smokes cigars, Hx of mild aortic and mitral regurgitation per Echo 2012.   Patient denies shortness of breath, fever,  cough and chest pain at PAT appointment.  Patient verbalized understanding and agreement to the Pre-Surgical Instructions that were given to them at this PAT appointment. Patient was also educated of the need to review these PAT instructions again prior to his/her surgery.I reviewed the appropriate phone numbers to call if they have any and questions or concerns.

## 2022-12-07 NOTE — Patient Instructions (Addendum)
SURGICAL WAITING ROOM VISITATION Patients having surgery or a procedure may have no more than 2 support people in the waiting area - these visitors may rotate in the visitor waiting room.   Due to an increase in RSV and influenza rates and associated hospitalizations, children ages 42 and under may not visit patients in [place]. If the patient needs to stay at the hospital during part of their recovery, the visitor guidelines for inpatient rooms apply.  PRE-OP VISITATION  Pre-op nurse will coordinate an appropriate time for 1 support person to accompany the patient in pre-op.  This support person may not rotate.  This visitor will be contacted when the time is appropriate for the visitor to come back in the pre-op area.  Please refer to the [facility] website for the visitor guidelines for Inpatients (after your surgery is over and you are in a regular room).  You are not required to quarantine at this time prior to your surgery. However, you must do this: Hand Hygiene often Do NOT share personal items Notify your provider if you are in close contact with someone who has COVID or you develop fever 100.4 or greater, new onset of sneezing, cough, sore throat, shortness of breath or body aches.  If you test positive for Covid or have been in contact with anyone that has tested positive in the last 10 days please notify you surgeon.    Your procedure is scheduled on:  Monday December 18, 2022   Report to [facility] Main Entrance: [place] entrance where the [employer] is available.   Report to admitting at: 07:00 AM  +++++Call this number if you have any questions or problems the morning of surgery [phone]  Do not eat food after Midnight the night prior to your surgery/procedure.  After Midnight you may have the following liquids until   06:30 AM DAY OF SURGERY  Clear Liquid Diet Water Black Coffee (sugar ok, NO MILK/CREAM OR CREAMERS)  Tea (sugar ok, NO  MILK/CREAM OR CREAMERS) regular and decaf                             Plain Jell-O  with no fruit (NO RED)                                           Fruit ices (not with fruit pulp, NO RED)                                     Popsicles (NO RED)                                                                  Juice: apple, WHITE grape, WHITE cranberry Sports drinks like Gatorade or Powerade (NO RED)                    The day of surgery:  Drink ONE (1) Pre-Surgery  G2 at  06:30 AM the morning of surgery. Drink in one sitting. Do not  sip.  This drink was given to you during your hospital pre-op appointment visit. Nothing else to drink after completing the Pre-Surgery  G2 : No candy, chewing gum or throat lozenges.    FOLLOW ANY ADDITIONAL PRE OP INSTRUCTIONS YOU RECEIVED FROM YOUR SURGEON'S OFFICE!!!   Oral Hygiene is also important to reduce your risk of infection.        Remember - BRUSH YOUR TEETH THE MORNING OF SURGERY WITH YOUR REGULAR TOOTHPASTE  Do NOT smoke after Midnight the night before surgery.  Metformin '500mg'$ : Take your usual dose the day before surgery(12-17-22). DO NOT take on the day of surgery (12-18-22).    Take ONLY these medicines the morning of surgery with A SIP OF WATER: levothyroxine (Synthroid), minocycline (Dynacin)??                   You may not have any metal on your body including jewelry, and body piercing  Do not wear  lotions, powders, cologne, or deodorant  Men may shave face and neck.  You may bring a small overnight bag with you on the day of surgery, only pack items that are not valuable. [facility] IS NOT RESPONSIBLE   FOR VALUABLES THAT ARE LOST OR STOLEN.   Do not bring your home medications to the hospital. The Pharmacy will dispense medications listed on your medication list to you during your admission in the Hospital.   Please read over the following fact sheets you were given: IF YOU HAVE QUESTIONS ABOUT YOUR PRE-OP INSTRUCTIONS,  PLEASE CALL [phone]  ([place])   [facility] - Preparing for Surgery Before surgery, you can play an important role.  Because skin is not sterile, your skin needs to be as free of germs as possible.  You can reduce the number of germs on your skin by washing with CHG (chlorahexidine gluconate) soap before surgery.  CHG is an antiseptic cleaner which kills germs and bonds with the skin to continue killing germs even after washing. Please DO NOT use if you have an allergy to CHG or antibacterial soaps.  If your skin becomes reddened/irritated stop using the CHG and inform your nurse when you arrive at Short Stay. Do not shave (including legs and underarms) for at least 48 hours prior to the first CHG shower.  You may shave your face/neck.  Please follow these instructions carefully:  1.  Shower with CHG Soap the night before surgery and the  morning of surgery.  2.  If you choose to wash your hair, wash your hair first as usual with your normal  shampoo.  3.  After you shampoo, rinse your hair and body thoroughly to remove the shampoo.                             4.  Use CHG as you would any other liquid soap.  You can apply chg directly to the skin and wash.  Gently with a scrungie or clean washcloth.  5.  Apply the CHG Soap to your body ONLY FROM THE NECK DOWN.   Do not use on face/ open                           Wound or open sores. Avoid contact with eyes, ears mouth and genitals (private parts).  Wash face,  Genitals (private parts) with your normal soap.             6.  Wash thoroughly, paying special attention to the area where your  surgery  will be performed.  7.  Thoroughly rinse your body with warm water from the neck down.  8.  DO NOT shower/wash with your normal soap after using and rinsing off the CHG Soap.            9.  Pat yourself dry with a clean towel.            10.  Wear clean pajamas.            11.  Place clean sheets on your bed the night of your  first shower and do not  sleep with pets.  ON THE DAY OF SURGERY : Do not apply any lotions/deodorants the morning of surgery.  Please wear clean clothes to the hospital/surgery center.    FAILURE TO FOLLOW THESE INSTRUCTIONS MAY RESULT IN THE CANCELLATION OF YOUR SURGERY  PATIENT SIGNATURE_________________________________  NURSE SIGNATURE__________________________________  ________________________________________________________________________       Adam Phenix    An incentive spirometer is a tool that can help keep your lungs clear and active. This tool measures how well you are filling your lungs with each breath. Taking long deep breaths may help reverse or decrease the chance of developing breathing (pulmonary) problems (especially infection) following: A long period of time when you are unable to move or be active. BEFORE THE PROCEDURE  If the spirometer includes an indicator to show your best effort, your nurse or respiratory therapist will set it to a desired goal. If possible, sit up straight or lean slightly forward. Try not to slouch. Hold the incentive spirometer in an upright position. INSTRUCTIONS FOR USE  Sit on the edge of your bed if possible, or sit up as far as you can in bed or on a chair. Hold the incentive spirometer in an upright position. Breathe out normally. Place the mouthpiece in your mouth and seal your lips tightly around it. Breathe in slowly and as deeply as possible, raising the piston or the ball toward the top of the column. Hold your breath for 3-5 seconds or for as long as possible. Allow the piston or ball to fall to the bottom of the column. Remove the mouthpiece from your mouth and breathe out normally. Rest for a few seconds and repeat Steps 1 through 7 at least 10 times every 1-2 hours when you are awake. Take your time and take a few normal breaths between deep breaths. The spirometer may include an indicator to show your best  effort. Use the indicator as a goal to work toward during each repetition. After each set of 10 deep breaths, practice coughing to be sure your lungs are clear. If you have an incision (the cut made at the time of surgery), support your incision when coughing by placing a pillow or rolled up towels firmly against it. Once you are able to get out of bed, walk around indoors and cough well. You may stop using the incentive spirometer when instructed by your caregiver.  RISKS AND COMPLICATIONS Take your time so you do not get dizzy or light-headed. If you are in pain, you may need to take or ask for pain medication before doing incentive spirometry. It is harder to take a deep breath if you are having pain. AFTER USE Rest and breathe slowly and  easily. It can be helpful to keep track of a log of your progress. Your caregiver can provide you with a simple table to help with this. If you are using the spirometer at home, follow these instructions: [place] IF:  You are having difficultly using the spirometer. You have trouble using the spirometer as often as instructed. Your pain medication is not giving enough relief while using the spirometer. You develop fever of 100.5 F (38.1 C) or higher.                                                                                                    SEEK IMMEDIATE MEDICAL CARE IF:  You cough up bloody sputum that had not been present before. You develop fever of 102 F (38.9 C) or greater. You develop worsening pain at or near the incision site. MAKE SURE YOU:  Understand these instructions. Will watch your condition. Will get help right away if you are not doing well or get worse. Document Released: 03/19/2007 Document Revised: 01/29/2012 Document Reviewed: 05/20/2007 [facility] Patient Information 2014 [place], [place].

## 2022-12-08 ENCOUNTER — Encounter (HOSPITAL_COMMUNITY)
Admission: RE | Admit: 2022-12-08 | Discharge: 2022-12-08 | Disposition: A | Discharge status: Home / Self Care | Payer: Medicare Other | Source: Ambulatory Visit | Attending: Orthopedic Surgery | Admitting: Orthopedic Surgery

## 2022-12-08 ENCOUNTER — Other Ambulatory Visit: Payer: Self-pay

## 2022-12-08 ENCOUNTER — Encounter (HOSPITAL_COMMUNITY): Payer: Self-pay

## 2022-12-08 VITALS — BP 152/72 | HR 76 | Temp 98.1°F | Resp 16 | Ht 69.0 in | Wt 222.0 lb

## 2022-12-08 DIAGNOSIS — Z01812 Encounter for preprocedural laboratory examination: Secondary | ICD-10-CM | POA: Diagnosis present

## 2022-12-08 DIAGNOSIS — R7303 Prediabetes: Secondary | ICD-10-CM

## 2022-12-08 DIAGNOSIS — I10 Essential (primary) hypertension: Secondary | ICD-10-CM

## 2022-12-08 DIAGNOSIS — Z01818 Encounter for other preprocedural examination: Secondary | ICD-10-CM

## 2022-12-08 HISTORY — DX: Cardiac murmur, unspecified: R01.1

## 2022-12-08 LAB — BASIC METABOLIC PANEL
Anion gap: 8 (ref 5–15)
BUN: 23 mg/dL (ref 8–23)
CO2: 24 mmol/L (ref 22–32)
Calcium: 9.3 mg/dL (ref 8.9–10.3)
Chloride: 105 mmol/L (ref 98–111)
Creatinine, Ser: 1.18 mg/dL (ref 0.61–1.24)
GFR, Estimated: >60 mL/min (ref >60)
Glucose, Bld: 130 mg/dL — ABNORMAL HIGH (ref 70–99)
Potassium: 4.1 mmol/L (ref 3.5–5.1)
Sodium: 137 mmol/L (ref 135–145)

## 2022-12-08 LAB — SURGICAL PCR SCREEN
MRSA, PCR: NEGATIVE
Staphylococcus aureus: NEGATIVE

## 2022-12-08 LAB — GLUCOSE, CAPILLARY: Glucose-Capillary: 139 mg/dL — ABNORMAL HIGH (ref 70–99)

## 2022-12-08 LAB — CBC
HCT: 43.1 % (ref 39.0–52.0)
Hemoglobin: 14.6 g/dL (ref 13.0–17.0)
MCH: 30 pg (ref 26.0–34.0)
MCHC: 33.9 g/dL (ref 30.0–36.0)
MCV: 88.7 fL (ref 80.0–100.0)
Platelets: 210 10*3/uL (ref 150–400)
RBC: 4.86 MIL/uL (ref 4.22–5.81)
RDW: 14.3 % (ref 11.5–15.5)
WBC: 6 10*3/uL (ref 4.0–10.5)
nRBC: 0 % (ref 0.0–0.2)

## 2022-12-17 NOTE — Anesthesia Preprocedure Evaluation (Signed)
Anesthesia Evaluation  Patient identified by MRN, date of birth, ID band Patient awake    Reviewed: Allergy & Precautions, NPO status , Patient's Chart, lab work & pertinent test results  Airway Mallampati: II  TM Distance: >3 FB Neck ROM: Full    Dental no notable dental hx. (+) Chipped, Dental Advisory Given,    Pulmonary Current SmokerPatient did not abstain from smoking. Had a cigar this Am   Pulmonary exam normal breath sounds clear to auscultation       Cardiovascular hypertension, Normal cardiovascular exam Rhythm:Regular Rate:Normal     Neuro/Psych  PSYCHIATRIC DISORDERS Anxiety        GI/Hepatic   Endo/Other  diabetes, Well Controlled, Type 2Hypothyroidism  Hx of thyroid ca  Renal/GU Lab Results      Component                Value               Date                      CREATININE               1.18                12/08/2022                   K                        4.1                 12/08/2022                     Musculoskeletal  (+) Arthritis , Osteoarthritis,    Abdominal   Peds  Hematology Lab Results      Component                Value               Date                              HGB                      14.6                12/08/2022                   PLT                      210                 12/08/2022              Anesthesia Other Findings ALL: sulfa  Reproductive/Obstetrics                             Anesthesia Physical Anesthesia Plan  ASA: 3  Anesthesia Plan: Regional and Spinal   Post-op Pain Management: Regional block* and Minimal or no pain anticipated   Induction:   PONV Risk Score and Plan: Treatment may vary due to age or medical condition  Airway Management Planned: Nasal Cannula and Natural Airway  Additional Equipment: None  Intra-op Plan:   Post-operative Plan:   Informed Consent: I have  reviewed the patients History and Physical,  chart, labs and discussed the procedure including the risks, benefits and alternatives for the proposed anesthesia with the patient or authorized representative who has indicated his/her understanding and acceptance.     Dental advisory given  Plan Discussed with: CRNA and Anesthesiologist  Anesthesia Plan Comments: (Spinal w R adductor canal)        Anesthesia Quick Evaluation

## 2022-12-18 ENCOUNTER — Other Ambulatory Visit: Payer: Self-pay

## 2022-12-18 ENCOUNTER — Encounter (HOSPITAL_COMMUNITY): Payer: Self-pay | Admitting: Orthopedic Surgery

## 2022-12-18 ENCOUNTER — Ambulatory Visit (HOSPITAL_BASED_OUTPATIENT_CLINIC_OR_DEPARTMENT_OTHER): Payer: Medicare Other | Admitting: Anesthesiology

## 2022-12-18 ENCOUNTER — Encounter (HOSPITAL_COMMUNITY)
Admission: RE | Disposition: A | Discharge status: Home / Self Care | Payer: Self-pay | Source: Ambulatory Visit | Attending: Orthopedic Surgery

## 2022-12-18 ENCOUNTER — Ambulatory Visit (HOSPITAL_COMMUNITY): Payer: Medicare Other | Admitting: Physician Assistant

## 2022-12-18 ENCOUNTER — Observation Stay (HOSPITAL_COMMUNITY)
Admission: RE | Admit: 2022-12-18 | Discharge: 2022-12-19 | Disposition: A | Discharge status: Home / Self Care | Payer: Medicare Other | Source: Ambulatory Visit | Attending: Orthopedic Surgery | Admitting: Orthopedic Surgery

## 2022-12-18 DIAGNOSIS — E039 Hypothyroidism, unspecified: Secondary | ICD-10-CM | POA: Insufficient documentation

## 2022-12-18 DIAGNOSIS — Z7984 Long term (current) use of oral hypoglycemic drugs: Secondary | ICD-10-CM | POA: Insufficient documentation

## 2022-12-18 DIAGNOSIS — M1711 Unilateral primary osteoarthritis, right knee: Secondary | ICD-10-CM | POA: Diagnosis present

## 2022-12-18 DIAGNOSIS — F17210 Nicotine dependence, cigarettes, uncomplicated: Secondary | ICD-10-CM

## 2022-12-18 DIAGNOSIS — E119 Type 2 diabetes mellitus without complications: Secondary | ICD-10-CM | POA: Insufficient documentation

## 2022-12-18 DIAGNOSIS — Z96652 Presence of left artificial knee joint: Secondary | ICD-10-CM | POA: Diagnosis not present

## 2022-12-18 DIAGNOSIS — R7303 Prediabetes: Secondary | ICD-10-CM

## 2022-12-18 DIAGNOSIS — Z79899 Other long term (current) drug therapy: Secondary | ICD-10-CM | POA: Insufficient documentation

## 2022-12-18 DIAGNOSIS — M179 Osteoarthritis of knee, unspecified: Secondary | ICD-10-CM

## 2022-12-18 DIAGNOSIS — F17290 Nicotine dependence, other tobacco product, uncomplicated: Secondary | ICD-10-CM | POA: Diagnosis not present

## 2022-12-18 DIAGNOSIS — I10 Essential (primary) hypertension: Secondary | ICD-10-CM

## 2022-12-18 DIAGNOSIS — Z85850 Personal history of malignant neoplasm of thyroid: Secondary | ICD-10-CM | POA: Diagnosis not present

## 2022-12-18 HISTORY — PX: TOTAL KNEE ARTHROPLASTY: SHX125

## 2022-12-18 LAB — GLUCOSE, CAPILLARY
Glucose-Capillary: 141 mg/dL — ABNORMAL HIGH (ref 70–99)
Glucose-Capillary: 154 mg/dL — ABNORMAL HIGH (ref 70–99)
Glucose-Capillary: 189 mg/dL — ABNORMAL HIGH (ref 70–99)
Glucose-Capillary: 280 mg/dL — ABNORMAL HIGH (ref 70–99)
Glucose-Capillary: 167 mg/dL — ABNORMAL HIGH (ref 70–99)

## 2022-12-18 LAB — HEMOGLOBIN A1C
Hgb A1c MFr Bld: 6.3 % — ABNORMAL HIGH (ref 4.8–5.6)
Mean Plasma Glucose: 134.11 mg/dL

## 2022-12-18 SURGERY — ARTHROPLASTY, KNEE, TOTAL
Anesthesia: Regional | Site: Knee | Laterality: Right

## 2022-12-18 MED ORDER — DIPHENHYDRAMINE HCL 12.5 MG/5ML PO ELIX: 12.5000 mg | ORAL_SOLUTION | ORAL | Status: DC | PRN

## 2022-12-18 MED ORDER — SODIUM CHLORIDE (PF) 0.9 % IJ SOLN
INTRAMUSCULAR | Status: DC | PRN
  Administered 2022-12-18: 60 mL

## 2022-12-18 MED ORDER — SODIUM CHLORIDE (PF) 0.9 % IJ SOLN
INTRAMUSCULAR | Status: AC
  Filled 2022-12-18: qty 50

## 2022-12-18 MED ORDER — ACETAMINOPHEN 10 MG/ML IV SOLN
1000.0000 mg | Freq: Four times a day (QID) | INTRAVENOUS | Status: DC
  Administered 2022-12-18: 1000 mg via INTRAVENOUS
  Filled 2022-12-18: qty 100

## 2022-12-18 MED ORDER — ONDANSETRON HCL 4 MG/2ML IJ SOLN: 4.0000 mg | Freq: Once | INTRAMUSCULAR | Status: DC | PRN

## 2022-12-18 MED ORDER — HYDROCHLOROTHIAZIDE 12.5 MG PO TABS
12.5000 mg | ORAL_TABLET | Freq: Every day | ORAL | Status: DC
  Administered 2022-12-19: 12.5 mg via ORAL
  Filled 2022-12-18: qty 1

## 2022-12-18 MED ORDER — PROPOFOL 500 MG/50ML IV EMUL
INTRAVENOUS | Status: DC | PRN
  Administered 2022-12-18: 20 mg via INTRAVENOUS
  Administered 2022-12-18: 30 mg via INTRAVENOUS
  Administered 2022-12-18: 75 ug/kg/min via INTRAVENOUS

## 2022-12-18 MED ORDER — SODIUM CHLORIDE 0.9 % IV SOLN: INTRAVENOUS | Status: DC

## 2022-12-18 MED ORDER — PHENOL 1.4 % MT LIQD: 1.0000 | OROMUCOSAL | Status: DC | PRN

## 2022-12-18 MED ORDER — OXYCODONE HCL 5 MG PO TABS
5.0000 mg | ORAL_TABLET | ORAL | Status: DC | PRN
  Administered 2022-12-18 – 2022-12-19 (×4): 10 mg via ORAL
  Filled 2022-12-18 (×4): qty 2

## 2022-12-18 MED ORDER — LACTATED RINGERS IV SOLN: INTRAVENOUS | Status: DC

## 2022-12-18 MED ORDER — MENTHOL 3 MG MT LOZG: 1.0000 | LOZENGE | OROMUCOSAL | Status: DC | PRN

## 2022-12-18 MED ORDER — METOCLOPRAMIDE HCL 5 MG/ML IJ SOLN: 5.0000 mg | Freq: Three times a day (TID) | INTRAMUSCULAR | Status: DC | PRN

## 2022-12-18 MED ORDER — LEVOTHYROXINE SODIUM 100 MCG PO TABS
200.0000 ug | ORAL_TABLET | Freq: Every day | ORAL | Status: DC
  Administered 2022-12-19: 200 ug via ORAL
  Filled 2022-12-18: qty 1
  Filled 2022-12-18: qty 2

## 2022-12-18 MED ORDER — CEFAZOLIN SODIUM-DEXTROSE 2-4 GM/100ML-% IV SOLN
2.0000 g | Freq: Four times a day (QID) | INTRAVENOUS | Status: AC
  Administered 2022-12-18 (×2): 2 g via INTRAVENOUS
  Filled 2022-12-18 (×2): qty 100

## 2022-12-18 MED ORDER — BUPIVACAINE IN DEXTROSE 0.75-8.25 % IT SOLN
INTRATHECAL | Status: DC | PRN
  Administered 2022-12-18: 1.6 mL via INTRATHECAL

## 2022-12-18 MED ORDER — ORAL CARE MOUTH RINSE: 15.0000 mL | Freq: Once | OROMUCOSAL | Status: AC

## 2022-12-18 MED ORDER — DEXAMETHASONE SODIUM PHOSPHATE 10 MG/ML IJ SOLN
8.0000 mg | Freq: Once | INTRAMUSCULAR | Status: AC
  Administered 2022-12-18: 8 mg via INTRAVENOUS

## 2022-12-18 MED ORDER — RIVAROXABAN 10 MG PO TABS
10.0000 mg | ORAL_TABLET | Freq: Every day | ORAL | Status: DC
  Administered 2022-12-19: 10 mg via ORAL
  Filled 2022-12-18: qty 1

## 2022-12-18 MED ORDER — ONDANSETRON HCL 4 MG/2ML IJ SOLN
INTRAMUSCULAR | Status: AC
  Filled 2022-12-18: qty 2

## 2022-12-18 MED ORDER — ONDANSETRON HCL 4 MG/2ML IJ SOLN: 4.0000 mg | Freq: Four times a day (QID) | INTRAMUSCULAR | Status: DC | PRN

## 2022-12-18 MED ORDER — BUPIVACAINE LIPOSOME 1.3 % IJ SUSP: 20.0000 mL | Freq: Once | INTRAMUSCULAR | Status: DC

## 2022-12-18 MED ORDER — CLONIDINE HCL (ANALGESIA) 100 MCG/ML EP SOLN
EPIDURAL | Status: DC | PRN
  Administered 2022-12-18: 100 ug

## 2022-12-18 MED ORDER — METHOCARBAMOL 500 MG IVPB - SIMPLE MED: 500.0000 mg | Freq: Four times a day (QID) | INTRAVENOUS | Status: DC | PRN

## 2022-12-18 MED ORDER — POLYETHYLENE GLYCOL 3350 17 G PO PACK: 17.0000 g | PACK | Freq: Every day | ORAL | Status: DC | PRN

## 2022-12-18 MED ORDER — BUPIVACAINE LIPOSOME 1.3 % IJ SUSP
INTRAMUSCULAR | Status: AC
  Filled 2022-12-18: qty 20

## 2022-12-18 MED ORDER — TRAMADOL HCL 50 MG PO TABS: 50.0000 mg | ORAL_TABLET | Freq: Four times a day (QID) | ORAL | Status: DC | PRN

## 2022-12-18 MED ORDER — MORPHINE SULFATE (PF) 2 MG/ML IV SOLN: 1.0000 mg | INTRAVENOUS | Status: DC | PRN

## 2022-12-18 MED ORDER — POVIDONE-IODINE 10 % EX SWAB: 2.0000 | Freq: Once | CUTANEOUS | Status: DC

## 2022-12-18 MED ORDER — ACETAMINOPHEN 10 MG/ML IV SOLN: 1000.0000 mg | Freq: Once | INTRAVENOUS | Status: DC | PRN

## 2022-12-18 MED ORDER — SODIUM CHLORIDE (PF) 0.9 % IJ SOLN
INTRAMUSCULAR | Status: AC
  Filled 2022-12-18: qty 10

## 2022-12-18 MED ORDER — FENTANYL CITRATE PF 50 MCG/ML IJ SOSY: 25.0000 ug | PREFILLED_SYRINGE | INTRAMUSCULAR | Status: DC | PRN

## 2022-12-18 MED ORDER — ONDANSETRON HCL 4 MG PO TABS: 4.0000 mg | ORAL_TABLET | Freq: Four times a day (QID) | ORAL | Status: DC | PRN

## 2022-12-18 MED ORDER — METHOCARBAMOL 500 MG PO TABS
500.0000 mg | ORAL_TABLET | Freq: Four times a day (QID) | ORAL | Status: DC | PRN
  Administered 2022-12-18 – 2022-12-19 (×3): 500 mg via ORAL
  Filled 2022-12-18 (×3): qty 1

## 2022-12-18 MED ORDER — ROSUVASTATIN CALCIUM 20 MG PO TABS
20.0000 mg | ORAL_TABLET | Freq: Every day | ORAL | Status: DC
  Administered 2022-12-19: 20 mg via ORAL
  Filled 2022-12-18: qty 1

## 2022-12-18 MED ORDER — MINOCYCLINE HCL 50 MG PO CAPS
50.0000 mg | ORAL_CAPSULE | Freq: Every day | ORAL | Status: DC
  Administered 2022-12-19: 50 mg via ORAL
  Filled 2022-12-18: qty 1

## 2022-12-18 MED ORDER — PROPOFOL 1000 MG/100ML IV EMUL
INTRAVENOUS | Status: AC
  Filled 2022-12-18: qty 100

## 2022-12-18 MED ORDER — BUPIVACAINE LIPOSOME 1.3 % IJ SUSP
INTRAMUSCULAR | Status: DC | PRN
  Administered 2022-12-18: 20 mL

## 2022-12-18 MED ORDER — TRANEXAMIC ACID-NACL 1000-0.7 MG/100ML-% IV SOLN
1000.0000 mg | INTRAVENOUS | Status: AC
  Administered 2022-12-18: 1000 mg via INTRAVENOUS
  Filled 2022-12-18: qty 100

## 2022-12-18 MED ORDER — SODIUM CHLORIDE 0.9 % IR SOLN
Status: DC | PRN
  Administered 2022-12-18 (×2): 1000 mL

## 2022-12-18 MED ORDER — FENTANYL CITRATE PF 50 MCG/ML IJ SOSY
50.0000 ug | PREFILLED_SYRINGE | INTRAMUSCULAR | Status: DC
  Administered 2022-12-18: 50 ug via INTRAVENOUS
  Filled 2022-12-18: qty 2

## 2022-12-18 MED ORDER — STERILE WATER FOR IRRIGATION IR SOLN
Status: DC | PRN
  Administered 2022-12-18: 2000 mL

## 2022-12-18 MED ORDER — ACETAMINOPHEN 500 MG PO TABS
1000.0000 mg | ORAL_TABLET | Freq: Four times a day (QID) | ORAL | Status: AC
  Administered 2022-12-18 – 2022-12-19 (×4): 1000 mg via ORAL
  Filled 2022-12-18 (×3): qty 2

## 2022-12-18 MED ORDER — DOCUSATE SODIUM 100 MG PO CAPS
100.0000 mg | ORAL_CAPSULE | Freq: Two times a day (BID) | ORAL | Status: DC
  Administered 2022-12-18 – 2022-12-19 (×2): 100 mg via ORAL
  Filled 2022-12-18 (×2): qty 1

## 2022-12-18 MED ORDER — BISACODYL 10 MG RE SUPP: 10.0000 mg | Freq: Every day | RECTAL | Status: DC | PRN

## 2022-12-18 MED ORDER — DEXAMETHASONE SODIUM PHOSPHATE 10 MG/ML IJ SOLN
INTRAMUSCULAR | Status: AC
  Filled 2022-12-18: qty 1

## 2022-12-18 MED ORDER — CEFAZOLIN SODIUM-DEXTROSE 2-4 GM/100ML-% IV SOLN
2.0000 g | INTRAVENOUS | Status: AC
  Administered 2022-12-18: 2 g via INTRAVENOUS
  Filled 2022-12-18: qty 100

## 2022-12-18 MED ORDER — INSULIN ASPART 100 UNIT/ML IJ SOLN
0.0000 [IU] | Freq: Three times a day (TID) | INTRAMUSCULAR | Status: DC
  Administered 2022-12-18: 3 [IU] via SUBCUTANEOUS
  Administered 2022-12-18: 8 [IU] via SUBCUTANEOUS
  Administered 2022-12-19 (×2): 3 [IU] via SUBCUTANEOUS

## 2022-12-18 MED ORDER — FLEET ENEMA 7-19 GM/118ML RE ENEM: 1.0000 | ENEMA | Freq: Once | RECTAL | Status: DC | PRN

## 2022-12-18 MED ORDER — ONDANSETRON HCL 4 MG/2ML IJ SOLN
INTRAMUSCULAR | Status: DC | PRN
  Administered 2022-12-18: 4 mg via INTRAVENOUS

## 2022-12-18 MED ORDER — CHLORHEXIDINE GLUCONATE 0.12 % MT SOLN
15.0000 mL | Freq: Once | OROMUCOSAL | Status: AC
  Administered 2022-12-18: 15 mL via OROMUCOSAL

## 2022-12-18 MED ORDER — ROPIVACAINE HCL 5 MG/ML IJ SOLN
INTRAMUSCULAR | Status: DC | PRN
  Administered 2022-12-18: 30 mL via PERINEURAL

## 2022-12-18 MED ORDER — METOCLOPRAMIDE HCL 5 MG PO TABS: 5.0000 mg | ORAL_TABLET | Freq: Three times a day (TID) | ORAL | Status: DC | PRN

## 2022-12-18 SURGICAL SUPPLY — 56 items
ATTUNE MED DOME PAT 41 KNEE (Knees) IMPLANT
ATTUNE PS FEM RT SZ 7 CEM KNEE (Femur) IMPLANT
ATTUNE PSRP INSR SZ7 8 KNEE (Insert) IMPLANT
BAG COUNTER SPONGE SURGICOUNT (BAG) IMPLANT
BAG SPEC THK2 15X12 ZIP CLS (MISCELLANEOUS) ×1
BAG SPNG CNTER NS LX DISP (BAG)
BAG ZIPLOCK 12X15 (MISCELLANEOUS) ×2 IMPLANT
BASE TIBIAL ROT PLAT SZ 7 KNEE (Knees) IMPLANT
BLADE SAG 18X100X1.27 (BLADE) ×2 IMPLANT
BLADE SAW SGTL 11.0X1.19X90.0M (BLADE) ×2 IMPLANT
BNDG ELASTIC 6X5.8 VLCR STR LF (GAUZE/BANDAGES/DRESSINGS) ×2 IMPLANT
BOWL SMART MIX CTS (DISPOSABLE) ×2 IMPLANT
BSPLAT TIB 7 CMNT ROT PLAT STR (Knees) ×1 IMPLANT
CEMENT HV SMART SET (Cement) ×4 IMPLANT
COVER SURGICAL LIGHT HANDLE (MISCELLANEOUS) ×2 IMPLANT
CUFF TOURN SGL QUICK 34 (TOURNIQUET CUFF) ×1
CUFF TRNQT CYL 34X4.125X (TOURNIQUET CUFF) ×2 IMPLANT
DRAPE INCISE IOBAN 66X45 STRL (DRAPES) ×2 IMPLANT
DRAPE U-SHAPE 47X51 STRL (DRAPES) ×2 IMPLANT
DRSG AQUACEL AG ADV 3.5X10 (GAUZE/BANDAGES/DRESSINGS) ×2 IMPLANT
DURAPREP 26ML APPLICATOR (WOUND CARE) ×2 IMPLANT
ELECT REM PT RETURN 15FT ADLT (MISCELLANEOUS) ×2 IMPLANT
GLOVE BIO SURGEON STRL SZ 6.5 (GLOVE) IMPLANT
GLOVE BIO SURGEON STRL SZ7.5 (GLOVE) IMPLANT
GLOVE BIO SURGEON STRL SZ8 (GLOVE) ×2 IMPLANT
GLOVE BIOGEL PI IND STRL 6.5 (GLOVE) IMPLANT
GLOVE BIOGEL PI IND STRL 7.0 (GLOVE) IMPLANT
GLOVE BIOGEL PI IND STRL 8 (GLOVE) ×2 IMPLANT
GOWN STRL REUS W/ TWL LRG LVL3 (GOWN DISPOSABLE) ×2 IMPLANT
GOWN STRL REUS W/ TWL XL LVL3 (GOWN DISPOSABLE) IMPLANT
GOWN STRL REUS W/TWL LRG LVL3 (GOWN DISPOSABLE) ×1
GOWN STRL REUS W/TWL XL LVL3 (GOWN DISPOSABLE)
HANDPIECE INTERPULSE COAX TIP (DISPOSABLE) ×1
HOLDER FOLEY CATH W/STRAP (MISCELLANEOUS) IMPLANT
IMMOBILIZER KNEE 20 (SOFTGOODS) ×1
IMMOBILIZER KNEE 20 THIGH 36 (SOFTGOODS) ×2 IMPLANT
KIT TURNOVER KIT A (KITS) IMPLANT
MANIFOLD NEPTUNE II (INSTRUMENTS) ×2 IMPLANT
NS IRRIG 1000ML POUR BTL (IV SOLUTION) ×2 IMPLANT
PACK TOTAL KNEE CUSTOM (KITS) ×2 IMPLANT
PADDING CAST COTTON 6X4 STRL (CAST SUPPLIES) ×4 IMPLANT
PIN STEINMAN FIXATION KNEE (PIN) IMPLANT
PROTECTOR NERVE ULNAR (MISCELLANEOUS) ×2 IMPLANT
SET HNDPC FAN SPRY TIP SCT (DISPOSABLE) ×2 IMPLANT
SPIKE FLUID TRANSFER (MISCELLANEOUS) ×2 IMPLANT
STRIP CLOSURE SKIN 1/2X4 (GAUZE/BANDAGES/DRESSINGS) ×4 IMPLANT
SUT MNCRL AB 4-0 PS2 18 (SUTURE) ×2 IMPLANT
SUT STRATAFIX 0 PDS 27 VIOLET (SUTURE) ×1
SUT VIC AB 2-0 CT1 27 (SUTURE) ×3
SUT VIC AB 2-0 CT1 TAPERPNT 27 (SUTURE) ×6 IMPLANT
SUTURE STRATFX 0 PDS 27 VIOLET (SUTURE) ×2 IMPLANT
TIBIAL BASE ROT PLAT SZ 7 KNEE (Knees) ×1 IMPLANT
TRAY FOLEY MTR SLVR 16FR STAT (SET/KITS/TRAYS/PACK) ×2 IMPLANT
TUBE SUCTION HIGH CAP CLEAR NV (SUCTIONS) ×2 IMPLANT
WATER STERILE IRR 1000ML POUR (IV SOLUTION) ×4 IMPLANT
WRAP KNEE MAXI GEL POST OP (GAUZE/BANDAGES/DRESSINGS) ×2 IMPLANT

## 2022-12-18 NOTE — Progress Notes (Signed)
Orthopedic Tech Progress Note Patient Details:  Todd Roy 06/29/1946 [id number]  CPM Right Knee CPM Right Knee: Off Right Knee Flexion (Degrees): 40 Right Knee Extension (Degrees): 10  Post Interventions Patient Tolerated: Well  Vernona Rieger 12/18/2022, 3:49 PM

## 2022-12-18 NOTE — Anesthesia Procedure Notes (Signed)
Procedure Name: MAC Date/Time: 12/18/2022 9:22 AM  Performed by: Victoriano Lain, CRNAPre-anesthesia Checklist: Patient identified, Emergency Drugs available, Suction available, Patient being monitored and Timeout performed Oxygen Delivery Method: Simple face mask Placement Confirmation: positive ETCO2 Dental Injury: Teeth and Oropharynx as per pre-operative assessment

## 2022-12-18 NOTE — Interval H&P Note (Signed)
History and Physical Interval Note:  12/18/2022 8:28 AM  Todd Roy  has presented today for surgery, with the diagnosis of right knee osteoarthritis.  The various methods of treatment have been discussed with the patient and family. After consideration of risks, benefits and other options for treatment, the patient has consented to  Procedure(s): TOTAL KNEE ARTHROPLASTY (Right) as a surgical intervention.  The patient's history has been reviewed, patient examined, no change in status, stable for surgery.  I have reviewed the patient's chart and labs.  Questions were answered to the patient's satisfaction.     Pilar Plate Arael Piccione

## 2022-12-18 NOTE — Evaluation (Signed)
Physical Therapy Evaluation Patient Details Name: Todd Roy MRN: [id number] DOB: 22-Feb-1946 Today's Date: 12/18/2022  History of Present Illness  Pt is 77 yo male s/p R TKA on 12/18/22.  Pt with hx including but not limited to HTN, arthritis, thyroid CA, DM, L TKA 7/23  Clinical Impression  Pt is s/p TKA resulting in the deficits listed below (see PT Problem List). At baseline, pt is independent and working.  He lives alone but has friends/family coming into assist for the next 2 weeks.  Pt has necessary DME.  Today, pt with good pain control, ROM, and quad activation. He required min guard for transfers and to ambulate 40' with RW.  Pt expected to progress well with therapy. Pt will benefit from skilled PT to increase their independence and safety with mobility to allow discharge to the venue listed below.         Recommendations for follow up therapy are one component of a multi-disciplinary discharge planning process, led by the attending physician.  Recommendations may be updated based on patient status, additional functional criteria and insurance authorization.  Follow Up Recommendations Follow physician's recommendations for discharge plan and follow up therapies      Assistance Recommended at Discharge Frequent or constant Supervision/Assistance  Patient can return home with the following  A little help with walking and/or transfers;A little help with bathing/dressing/bathroom;Assistance with cooking/housework;Help with stairs or ramp for entrance    Equipment Recommendations None recommended by PT  Recommendations for Other Services       Functional Status Assessment Patient has had a recent decline in their functional status and demonstrates the ability to make significant improvements in function in a reasonable and predictable amount of time.     Precautions / Restrictions Precautions Precautions: Fall Required Braces or Orthoses: Knee Immobilizer - Right Knee  Immobilizer - Right: Discontinue once straight leg raise with < 10 degree lag Restrictions Weight Bearing Restrictions: Yes RLE Weight Bearing: Weight bearing as tolerated      Mobility  Bed Mobility Overal bed mobility: Needs Assistance Bed Mobility: Supine to Sit     Supine to sit: Min guard, HOB elevated     General bed mobility comments: increased time but no assist    Transfers Overall transfer level: Needs assistance Equipment used: Rolling walker (2 wheels) Transfers: Sit to/from Stand Sit to Stand: Min guard, From elevated surface           General transfer comment: cues for hand placement and  R LE management    Ambulation/Gait Ambulation/Gait assistance: Min guard Gait Distance (Feet): 80 Feet Assistive device: Rolling walker (2 wheels) Gait Pattern/deviations: Step-through pattern, Decreased stance time - right, Decreased weight shift to right, Trunk flexed Gait velocity: decreased     General Gait Details: cued for RW proximity and posture  Stairs            Wheelchair Mobility    Modified Rankin (Stroke Patients Only)       Balance Overall balance assessment: Needs assistance Sitting-balance support: No upper extremity supported Sitting balance-Leahy Scale: Good     Standing balance support: Bilateral upper extremity supported Standing balance-Leahy Scale: Poor Standing balance comment: Requiring RW but stable with RW                             Pertinent Vitals/Pain Pain Assessment Pain Assessment: 0-10 Pain Score: 4  Pain Location: R knee Pain Descriptors / Indicators: Discomfort  Pain Intervention(s): Limited activity within patient's tolerance, Monitored during session, Premedicated before session, Ice applied    Home Living Family/patient expects to be discharged to:: Private residence Living Arrangements: Alone Available Help at Discharge: Friend(s);Available 24 hours/day (2 friends flown into stay with him  the first week and then a cousin for a week) Type of Home: House Home Access: Stairs to enter Entrance Stairs-Rails: Chemical engineer of Steps: 3   Home Layout: One level;Able to live on main level with bedroom/bathroom Home Equipment: Shower seat - built Medical sales representative (2 wheels);Cane - single point      Prior Function Prior Level of Function : Independent/Modified Independent             Mobility Comments: could ambulate in community without AD; occasionally used cane if knee pain severe ADLs Comments: independent with adls and iadls; working     Journalist, newspaper        Extremity/Trunk Assessment   Upper Extremity Assessment Upper Extremity Assessment: Overall WFL for tasks assessed    Lower Extremity Assessment Lower Extremity Assessment: LLE deficits/detail;RLE deficits/detail RLE Deficits / Details: Expected post op changes; ROM: knee ~10 degrees to 80 degrees; MMT : ankle 5/5, knee and hip 3/5 LLE Deficits / Details: ROM WFL; MMT 5/5    Cervical / Trunk Assessment Cervical / Trunk Assessment: Kyphotic  Communication   Communication: No difficulties  Cognition Arousal/Alertness: Awake/alert Behavior During Therapy: WFL for tasks assessed/performed Overall Cognitive Status: Within Functional Limits for tasks assessed                                          General Comments General comments (skin integrity, edema, etc.): Encouraged to perform ankle pumps and quad sets tonight    Exercises Total Joint Exercises Ankle Circles/Pumps: AROM, Both, 10 reps, Seated Quad Sets: AROM, Both, 10 reps, Seated   Assessment/Plan    PT Assessment Patient needs continued PT services  PT Problem List Decreased strength;Pain;Decreased range of motion;Decreased activity tolerance;Decreased balance;Decreased safety awareness;Decreased mobility;Decreased knowledge of precautions       PT Treatment Interventions DME instruction;Therapeutic  exercise;Gait training;Balance training;Stair training;Functional mobility training;Therapeutic activities;Patient/family education    PT Goals (Current goals can be found in the Care Plan section)  Acute Rehab PT Goals Patient Stated Goal: return home PT Goal Formulation: With patient Time For Goal Achievement: 01/01/23 Potential to Achieve Goals: Good    Frequency 7X/week     Co-evaluation               AM-PAC PT "6 Clicks" Mobility  Outcome Measure Help needed turning from your back to your side while in a flat bed without using bedrails?: A Little Help needed moving from lying on your back to sitting on the side of a flat bed without using bedrails?: A Little Help needed moving to and from a bed to a chair (including a wheelchair)?: A Little Help needed standing up from a chair using your arms (e.g., wheelchair or bedside chair)?: A Little Help needed to walk in hospital room?: A Little Help needed climbing 3-5 steps with a railing? : A Little 6 Click Score: 18    End of Session Equipment Utilized During Treatment: Gait belt Activity Tolerance: Patient tolerated treatment well Patient left: with call bell/phone within reach;with chair alarm set;in chair Nurse Communication: Mobility status PT Visit Diagnosis: Other abnormalities of gait and mobility (  R26.89);Muscle weakness (generalized) (M62.81)    Time: 2419-9144 PT Time Calculation (min) (ACUTE ONLY): 24 min   Charges:   PT Evaluation $PT Eval Low Complexity: 1 Low PT Treatments $Gait Training: 8-22 mins        Abran Richard, PT Acute Rehab [facility] [place] 12/18/2022, 4:59 PM

## 2022-12-18 NOTE — Anesthesia Procedure Notes (Signed)
Anesthesia Regional Block: Adductor canal block   Pre-Anesthetic Checklist: , timeout performed,  Correct Patient, Correct Site, Correct Laterality,  Correct Procedure, Correct Position, site marked,  Risks and benefits discussed,  Surgical consent,  Pre-op evaluation,  At surgeon's request and post-op pain management  Laterality: Lower and Right  Prep: chloraprep       Needles:  Injection technique: Single-shot  Needle Type: Echogenic Needle     Needle Length: 9cm  Needle Gauge: 22     Additional Needles:   Procedures:,,,, ultrasound used (permanent image in chart),,    Narrative:  Start time: 12/18/2022 8:35 AM End time: 12/18/2022 8:40 AM Injection made incrementally with aspirations every 5 mL.  Performed by: Personally  Anesthesiologist: Barnet Glasgow, MD  Additional Notes: Block assessed prior to surgery. Pt tolerated procedure well.

## 2022-12-18 NOTE — Anesthesia Procedure Notes (Signed)
Spinal  Start time: 12/18/2022 9:23 AM End time: 12/18/2022 9:27 AM Reason for block: surgical anesthesia Staffing Resident/CRNA: Victoriano Lain, CRNA Performed by: Victoriano Lain, CRNA Authorized by: Barnet Glasgow, MD   Preanesthetic Checklist Completed: patient identified, IV checked, site marked, risks and benefits discussed, surgical consent, monitors and equipment checked, pre-op evaluation and timeout performed Spinal Block Patient position: sitting Prep: DuraPrep and site prepped and draped Patient monitoring: cardiac monitor, continuous pulse ox, blood pressure and heart rate Approach: midline Location: L3-4 Injection technique: single-shot Needle Needle type: Pencan  Needle gauge: 24 G Assessment Sensory level: T4 Events: CSF return Additional Notes Pt placed on monitors, O2 via FM and sedation for pt comfort. Placed in sitting position for spinal placement. Sterile prep and drape of back. Site anesthetized. One attempt by CRNA. Clear free flowing CSF obtained. - heme. Pt tolerated well. VSS. Placed supine after spinal placement

## 2022-12-18 NOTE — Anesthesia Postprocedure Evaluation (Signed)
Anesthesia Post Note  Patient: Todd Roy  Procedure(s) Performed: TOTAL KNEE ARTHROPLASTY (Right: Knee)     Patient location during evaluation: Nursing Unit Anesthesia Type: Regional and Spinal Level of consciousness: oriented and awake and alert Pain management: pain level controlled Vital Signs Assessment: post-procedure vital signs reviewed and stable Respiratory status: spontaneous breathing and respiratory function stable Cardiovascular status: blood pressure returned to baseline and stable Postop Assessment: no headache, no backache, no apparent nausea or vomiting and patient able to bend at knees Anesthetic complications: no  No notable events documented.  Last Vitals:  Vitals:   12/18/22 1200 12/18/22 1218  BP: (!) 148/71 (!) 147/83  Pulse: (!) 54 (!) 56  Resp: 15 18  Temp: 36.8 C 36.7 C  SpO2: 100% 98%    Last Pain:  Vitals:   12/18/22 1218  TempSrc: Oral  PainSc:     LLE Motor Response: Purposeful movement (12/18/22 1228) LLE Sensation: Decreased (12/18/22 1228) RLE Motor Response: Purposeful movement (12/18/22 1228) RLE Sensation: Decreased (12/18/22 1228) L Sensory Level: L2-Upper inner thigh, upper buttock (12/18/22 1228) R Sensory Level: L2-Upper inner thigh, upper buttock (12/18/22 1228)  Barnet Glasgow

## 2022-12-18 NOTE — Op Note (Signed)
OPERATIVE REPORT-TOTAL KNEE ARTHROPLASTY   Pre-operative diagnosis- Osteoarthritis  Right knee(s)  Post-operative diagnosis- Osteoarthritis Right knee(s)  Procedure-  Right  Total Knee Arthroplasty  Surgeon- Todd Plover. Cassadee Vanzandt, MD  Assistant- Molli Barrows, PA-C   Anesthesia-   Adductor canal block and spinal  EBL-50 mL   Drains None  Tourniquet time-  Total Tourniquet Time Documented: Thigh (Right) - 35 minutes Total: Thigh (Right) - 35 minutes     Complications- None  Condition-PACU - hemodynamically stable.   Brief Clinical Note  Todd Roy is a 77 y.o. year old male with end stage OA of his right knee with progressively worsening pain and dysfunction. He has constant pain, with activity and at rest and significant functional deficits with difficulties even with ADLs. He has had extensive non-op management including analgesics, injections of cortisone and viscosupplements, and home exercise program, but remains in significant pain with significant dysfunction. Radiographs show bone on bone arthritis medial and patellofemoral. He presents now for right Total Knee Arthroplasty.     Procedure in detail---   The patient is brought into the operating room and positioned supine on the operating table. After successful administration of  Adductor canal block and spinal,   a tourniquet is placed high on the  Right thigh(s) and the lower extremity is prepped and draped in the usual sterile fashion. Time out is performed by the operating team and then the  Right lower extremity is wrapped in Esmarch, knee flexed and the tourniquet inflated to 300 mmHg.       A midline incision is made with a ten blade through the subcutaneous tissue to the level of the extensor mechanism. A fresh blade is used to make a medial parapatellar arthrotomy. Soft tissue over the proximal medial tibia is subperiosteally elevated to the joint line with a knife and into the semimembranosus bursa with a Cobb  elevator. Soft tissue over the proximal lateral tibia is elevated with attention being paid to avoiding the patellar tendon on the tibial tubercle. The patella is everted, knee flexed 90 degrees and the ACL and PCL are removed. Findings are bone on bone medial and patellofemoral with large global osteophytes        The drill is used to create a starting hole in the distal femur and the canal is thoroughly irrigated with sterile saline to remove the fatty contents. The 5 degree Right  valgus alignment guide is placed into the femoral canal and the distal femoral cutting block is pinned to remove 9 mm off the distal femur. Resection is made with an oscillating saw.      The tibia is subluxed forward and the menisci are removed. The extramedullary alignment guide is placed referencing proximally at the medial aspect of the tibial tubercle and distally along the second metatarsal axis and tibial crest. The block is pinned to remove 45m off the more deficient medial  side. Resection is made with an oscillating saw. Size 7is the most appropriate size for the tibia and the proximal tibia is prepared with the modular drill and keel punch for that size.      The femoral sizing guide is placed and size 7 is most appropriate. Rotation is marked off the epicondylar axis and confirmed by creating a rectangular flexion gap at 90 degrees. The size 7 cutting block is pinned in this rotation and the anterior, posterior and chamfer cuts are made with the oscillating saw. The intercondylar block is then placed and that cut is  made.      Trial size 7 tibial component, trial size 7 posterior stabilized femur and a 8  mm posterior stabilized rotating platform insert trial is placed. Full extension is achieved with excellent varus/valgus and anterior/posterior balance throughout full range of motion. The patella is everted and thickness measured to be 27  mm. Free hand resection is taken to 15 mm, a 41 template is placed, lug holes  are drilled, trial patella is placed, and it tracks normally. Osteophytes are removed off the posterior femur with the trial in place. All trials are removed and the cut bone surfaces prepared with pulsatile lavage. Cement is mixed and once ready for implantation, the size 7 tibial implant, size  7 posterior stabilized femoral component, and the size 41 patella are cemented in place and the patella is held with the clamp. The trial insert is placed and the knee held in full extension. The Exparel (20 ml mixed with 60 ml saline) is injected into the extensor mechanism, posterior capsule, medial and lateral gutters and subcutaneous tissues.  All extruded cement is removed and once the cement is hard the permanent 8 mm posterior stabilized rotating platform insert is placed into the tibial tray.      The wound is copiously irrigated with saline solution and the extensor mechanism closed with # 0 Stratofix suture. The tourniquet is released for a total tourniquet time of 35  minutes. Flexion against gravity is 140 degrees and the patella tracks normally. Subcutaneous tissue is closed with 2.0 vicryl and subcuticular with running 4.0 Monocryl. The incision is cleaned and dried and steri-strips and a bulky sterile dressing are applied. The limb is placed into a knee immobilizer and the patient is awakened and transported to recovery in stable condition.      Please note that a surgical assistant was a medical necessity for this procedure in order to perform it in a safe and expeditious manner. Surgical assistant was necessary to retract the ligaments and vital neurovascular structures to prevent injury to them and also necessary for proper positioning of the limb to allow for anatomic placement of the prosthesis.   Todd Plover Quadir Muns, MD    12/18/2022, 10:38 AM

## 2022-12-18 NOTE — Progress Notes (Signed)
Orthopedic Tech Progress Note Patient Details:  Todd Roy 11-02-1946 [id number]  CPM Right Knee CPM Right Knee: On Right Knee Flexion (Degrees): 40 Right Knee Extension (Degrees): 10  Post Interventions Patient Tolerated: Well  Todd Roy Todd Roy 12/18/2022, 11:50 AM

## 2022-12-18 NOTE — Discharge Instructions (Addendum)
Gaynelle Arabian, MD Total Joint Specialist [facility] [place] [address], [address] [place], [place] [address] [phone]  TOTAL KNEE REPLACEMENT POSTOPERATIVE DIRECTIONS    Knee Rehabilitation, Guidelines Following Surgery  Results after knee surgery are often greatly improved when you follow the exercise, range of motion and muscle strengthening exercises prescribed by your doctor. Safety measures are also important to protect the knee from further injury. If any of these exercises cause you to have increased pain or swelling in your knee joint, decrease the amount until you are comfortable again and slowly increase them. If you have problems or questions, call your caregiver or physical therapist for advice.   BLOOD CLOT PREVENTION Take a 10 mg Xarelto once a day for three weeks following surgery. Then take an 81 mg Aspirin once a day for three weeks. Then discontinue Aspirin. You may resume your vitamins/supplements once you have discontinued the Xarelto. Do not take any NSAIDs (Advil, Aleve, Ibuprofen, Meloxicam, etc.) until you have discontinued the Xarelto.   HOME CARE INSTRUCTIONS  Remove items at home which could result in a fall. This includes throw rugs or furniture in walking pathways.  ICE to the affected knee as much as tolerated. Icing helps control swelling. If the swelling is well controlled you will be more comfortable and rehab easier. Continue to use ice on the knee for pain and swelling from surgery. You may notice swelling that will progress down to the foot and ankle. This is normal after surgery. Elevate the leg when you are not up walking on it.    Continue to use the breathing machine which will help keep your temperature down. It is common for your temperature to cycle up and down following surgery, especially at night when you are not up moving around and exerting yourself. The breathing machine keeps your lungs expanded and your temperature  down. Do not place pillow under the operative knee, focus on keeping the knee straight while resting  DIET You may resume your previous home diet once you are discharged from the hospital.  DRESSING / WOUND CARE / SHOWERING Keep your bulky bandage on for 2 days. On the third post-operative day you may remove the Ace bandage and gauze. There is a waterproof adhesive bandage on your skin which will stay in place until your first follow-up appointment. Once you remove this you will not need to place another bandage You may begin showering 3 days following surgery, but do not submerge the incision under water.  ACTIVITY For the first 5 days, the key is rest and control of pain and swelling Do your home exercises twice a day starting on post-operative day 3. On the days you go to physical therapy, just do the home exercises once that day. You should rest, ice and elevate the leg for 50 minutes out of every hour. Get up and walk/stretch for 10 minutes per hour. After 5 days you can increase your activity slowly as tolerated. Walk with your walker as instructed. Use the walker until you are comfortable transitioning to a cane. Walk with the cane in the opposite hand of the operative leg. You may discontinue the cane once you are comfortable and walking steadily. Avoid periods of inactivity such as sitting longer than an hour when not asleep. This helps prevent blood clots.  You may discontinue the knee immobilizer once you are able to perform a straight leg raise while lying down. You may resume a sexual relationship in one month or  when given the OK by your doctor.  You may return to work once you are cleared by your doctor.  Do not drive a car for 6 weeks or until released by your surgeon.  Do not drive while taking narcotics.  TED HOSE STOCKINGS Wear the elastic stockings on both legs for three weeks following surgery during the day. You may remove them at night for sleeping.  WEIGHT  BEARING Weight bearing as tolerated with assist device (walker, cane, etc) as directed, use it as long as suggested by your surgeon or therapist, typically at least 4-6 weeks.  POSTOPERATIVE CONSTIPATION PROTOCOL Constipation - defined medically as fewer than three stools per week and severe constipation as less than one stool per week.  One of the most common issues patients have following surgery is constipation.  Even if you have a regular bowel pattern at home, your normal regimen is likely to be disrupted due to multiple reasons following surgery.  Combination of anesthesia, postoperative narcotics, change in appetite and fluid intake all can affect your bowels.  In order to avoid complications following surgery, here are some recommendations in order to help you during your recovery period.  Colace (docusate) - Pick up an over-the-counter form of Colace or another stool softener and take twice a day as long as you are requiring postoperative pain medications.  Take with a full glass of water daily.  If you experience loose stools or diarrhea, hold the colace until you stool forms back up. If your symptoms do not get better within 1 week or if they get worse, check with your doctor. Dulcolax (bisacodyl) - Pick up over-the-counter and take as directed by the product packaging as needed to assist with the movement of your bowels.  Take with a full glass of water.  Use this product as needed if not relieved by Colace only.  MiraLax (polyethylene glycol) - Pick up over-the-counter to have on hand. MiraLax is a solution that will increase the amount of water in your bowels to assist with bowel movements.  Take as directed and can mix with a glass of water, juice, soda, coffee, or tea. Take if you go more than two days without a movement. Do not use MiraLax more than once per day. Call your doctor if you are still constipated or irregular after using this medication for 7 days in a row.  If you continue  to have problems with postoperative constipation, please contact the office for further assistance and recommendations.  If you experience "the worst abdominal pain ever" or develop nausea or vomiting, please contact the office immediatly for further recommendations for treatment.  ITCHING If you experience itching with your medications, try taking only a single pain pill, or even half a pain pill at a time.  You can also use Benadryl over the counter for itching or also to help with sleep.   MEDICATIONS See your medication summary on the "After Visit Summary" that the nursing staff will review with you prior to discharge.  You may have some home medications which will be placed on hold until you complete the course of blood thinner medication.  It is important for you to complete the blood thinner medication as prescribed by your surgeon.  Continue your approved medications as instructed at time of discharge.  PRECAUTIONS If you experience chest pain or shortness of breath - call 911 immediately for transfer to the hospital emergency department.  If you develop a fever greater that 101 F, purulent  drainage from wound, increased redness or drainage from wound, foul odor from the wound/dressing, or calf pain - CONTACT YOUR SURGEON.                                                   FOLLOW-UP APPOINTMENTS Make sure you keep all of your appointments after your operation with your surgeon and caregivers. You should call the office at the above phone number and make an appointment for approximately two weeks after the date of your surgery or on the date instructed by your surgeon outlined in the "After Visit Summary".  RANGE OF MOTION AND STRENGTHENING EXERCISES  Rehabilitation of the knee is important following a knee injury or an operation. After just a few days of immobilization, the muscles of the thigh which control the knee become weakened and shrink (atrophy). Knee exercises are designed to build up  the tone and strength of the thigh muscles and to improve knee motion. Often times heat used for twenty to thirty minutes before working out will loosen up your tissues and help with improving the range of motion but do not use heat for the first two weeks following surgery. These exercises can be done on a training (exercise) mat, on the floor, on a table or on a bed. Use what ever works the best and is most comfortable for you Knee exercises include:  Leg Lifts - While your knee is still immobilized in a splint or cast, you can do straight leg raises. Lift the leg to 60 degrees, hold for 3 sec, and slowly lower the leg. Repeat 10-20 times 2-3 times daily. Perform this exercise against resistance later as your knee gets better.  Quad and Hamstring Sets - Tighten up the muscle on the front of the thigh (Quad) and hold for 5-10 sec. Repeat this 10-20 times hourly. Hamstring sets are done by pushing the foot backward against an object and holding for 5-10 sec. Repeat as with quad sets.  Leg Slides: Lying on your back, slowly slide your foot toward your buttocks, bending your knee up off the floor (only go as far as is comfortable). Then slowly slide your foot back down until your leg is flat on the floor again. Angel Wings: Lying on your back spread your legs to the side as far apart as you can without causing discomfort.  A rehabilitation program following serious knee injuries can speed recovery and prevent re-injury in the future due to weakened muscles. Contact your doctor or a physical therapist for more information on knee rehabilitation.   POST-OPERATIVE OPIOID TAPER INSTRUCTIONS: It is important to wean off of your opioid medication as soon as possible. If you do not need pain medication after your surgery it is ok to stop day one. Opioids include: Codeine, Hydrocodone(Norco, Vicodin), Oxycodone(Percocet, oxycontin) and hydromorphone amongst others.  Long term and even short term use of opiods can  cause: Increased pain response Dependence Constipation Depression Respiratory depression And more.  Withdrawal symptoms can include Flu like symptoms Nausea, vomiting And more Techniques to manage these symptoms Hydrate well Eat regular healthy meals Stay active Use relaxation techniques(deep breathing, meditating, yoga) Do Not substitute Alcohol to help with tapering If you have been on opioids for less than two weeks and do not have pain than it is ok to stop all together.  Plan to  wean off of opioids This plan should start within one week post op of your joint replacement. Maintain the same interval or time between taking each dose and first decrease the dose.  Cut the total daily intake of opioids by one tablet each day Next start to increase the time between doses. The last dose that should be eliminated is the evening dose.   IF YOU ARE TRANSFERRED TO A SKILLED REHAB FACILITY If the patient is transferred to a skilled rehab facility following release from the hospital, a list of the current medications will be sent to the facility for the patient to continue.  When discharged from the skilled rehab facility, please have the facility set up the patient's Home Health Physical Therapy prior to being released. Also, the skilled facility will be responsible for providing the patient with their medications at time of release from the facility to include their pain medication, the muscle relaxants, and their blood thinner medication. If the patient is still at the rehab facility at time of the two week follow up appointment, the skilled rehab facility will also need to assist the patient in arranging follow up appointment in our office and any transportation needs.  MAKE SURE YOU:  Understand these instructions.  Get help right away if you are not doing well or get worse.   DENTAL ANTIBIOTICS:  In most cases prophylactic antibiotics for Dental procdeures after total joint surgery are  not necessary.  Exceptions are as follows:  1. History of prior total joint infection  2. Severely immunocompromised (Organ Transplant, cancer chemotherapy, Rheumatoid biologic meds such as Humera)  3. Poorly controlled diabetes (A1C &gt; 8.0, blood glucose over 200)  If you have one of these conditions, contact your surgeon for an antibiotic prescription, prior to your dental procedure.    Pick up stool softner and laxative for home use following surgery while on pain medications. Do not submerge incision under water. Please use good hand washing techniques while changing dressing each day. May shower starting three days after surgery. Please use a clean towel to pat the incision dry following showers. Continue to use ice for pain and swelling after surgery. Do not use any lotions or creams on the incision until instructed by your surgeon.  Information on my medicine - XARELTO (Rivaroxaban)  Why was Xarelto prescribed for you? Xarelto was prescribed for you to reduce the risk of blood clots forming after orthopedic surgery. The medical term for these abnormal blood clots is venous thromboembolism (VTE).  What do you need to know about xarelto ? Take your Xarelto ONCE DAILY at the same time every day. You may take it either with or without food.  If you have difficulty swallowing the tablet whole, you may crush it and mix in applesauce just prior to taking your dose.  Take Xarelto exactly as prescribed by your doctor and DO NOT stop taking Xarelto without talking to the doctor who prescribed the medication.  Stopping without other VTE prevention medication to take the place of Xarelto may increase your risk of developing a clot.  After discharge, you should have regular check-up appointments with your healthcare provider that is prescribing your Xarelto.    What do you do if you miss a dose? If you miss a dose, take it as soon as you remember on the same day then  continue your regularly scheduled once daily regimen the next day. Do not take two doses of Xarelto on the same day.   Important   Safety Information A possible side effect of Xarelto is bleeding. You should call your healthcare provider right away if you experience any of the following: Bleeding from an injury or your nose that does not stop. Unusual colored urine (red or dark brown) or unusual colored stools (red or black). Unusual bruising for unknown reasons. A serious fall or if you hit your head (even if there is no bleeding).  Some medicines may interact with Xarelto and might increase your risk of bleeding while on Xarelto. To help avoid this, consult your healthcare provider or pharmacist prior to using any new prescription or non-prescription medications, including herbals, vitamins, non-steroidal anti-inflammatory drugs (NSAIDs) and supplements.  This website has more information on Xarelto: www.xarelto.com.    

## 2022-12-18 NOTE — Transfer of Care (Signed)
Immediate Anesthesia Transfer of Care Note  Patient: Todd Roy  Procedure(s) Performed: TOTAL KNEE ARTHROPLASTY (Right: Knee)  Patient Location: PACU  Anesthesia Type:Spinal  Level of Consciousness: awake, alert , and oriented  Airway & Oxygen Therapy: Patient Spontanous Breathing and Patient connected to face mask oxygen  Post-op Assessment: Report given to RN  Post vital signs: Reviewed and stable  Last Vitals:  Vitals Value Taken Time  BP 131/77 12/18/22 1050  Temp    Pulse 56 12/18/22 1052  Resp 16 12/18/22 1052  SpO2 100 % 12/18/22 1052  Vitals shown include unvalidated device data.  Last Pain:  Vitals:   12/18/22 0725  TempSrc: Oral  PainSc: 0-No pain      Patients Stated Pain Goal: 3 (75/79/72 8206)  Complications: No notable events documented.

## 2022-12-19 ENCOUNTER — Encounter (HOSPITAL_COMMUNITY): Payer: Self-pay | Admitting: Orthopedic Surgery

## 2022-12-19 DIAGNOSIS — M1711 Unilateral primary osteoarthritis, right knee: Secondary | ICD-10-CM | POA: Diagnosis not present

## 2022-12-19 LAB — GLUCOSE, CAPILLARY
Glucose-Capillary: 164 mg/dL — ABNORMAL HIGH (ref 70–99)
Glucose-Capillary: 164 mg/dL — ABNORMAL HIGH (ref 70–99)

## 2022-12-19 LAB — CBC
HCT: 29.7 % — ABNORMAL LOW (ref 39.0–52.0)
Hemoglobin: 10.4 g/dL — ABNORMAL LOW (ref 13.0–17.0)
MCH: 30.7 pg (ref 26.0–34.0)
MCHC: 35 g/dL (ref 30.0–36.0)
MCV: 87.6 fL (ref 80.0–100.0)
Platelets: 156 10*3/uL (ref 150–400)
RBC: 3.39 MIL/uL — ABNORMAL LOW (ref 4.22–5.81)
RDW: 13.8 % (ref 11.5–15.5)
WBC: 9.3 10*3/uL (ref 4.0–10.5)
nRBC: 0 % (ref 0.0–0.2)

## 2022-12-19 LAB — BASIC METABOLIC PANEL
Anion gap: 9 (ref 5–15)
BUN: 18 mg/dL (ref 8–23)
CO2: 22 mmol/L (ref 22–32)
Calcium: 8.4 mg/dL — ABNORMAL LOW (ref 8.9–10.3)
Chloride: 102 mmol/L (ref 98–111)
Creatinine, Ser: 1.15 mg/dL (ref 0.61–1.24)
GFR, Estimated: >60 mL/min (ref >60)
Glucose, Bld: 198 mg/dL — ABNORMAL HIGH (ref 70–99)
Potassium: 4.1 mmol/L (ref 3.5–5.1)
Sodium: 133 mmol/L — ABNORMAL LOW (ref 135–145)

## 2022-12-19 MED ORDER — METHOCARBAMOL 500 MG PO TABS: 500.0000 mg | ORAL_TABLET | Freq: Four times a day (QID) | ORAL | 0 refills | Status: AC | PRN

## 2022-12-19 MED ORDER — TRAMADOL HCL 50 MG PO TABS: 50.0000 mg | ORAL_TABLET | Freq: Four times a day (QID) | ORAL | 0 refills | Status: AC | PRN

## 2022-12-19 MED ORDER — OXYCODONE HCL 5 MG PO TABS: 5.0000 mg | ORAL_TABLET | Freq: Four times a day (QID) | ORAL | 0 refills | Status: AC | PRN

## 2022-12-19 MED ORDER — RIVAROXABAN 10 MG PO TABS: 10.0000 mg | ORAL_TABLET | Freq: Every day | ORAL | 0 refills | Status: AC

## 2022-12-19 MED ORDER — ACETAMINOPHEN 500 MG PO TABS: 1000.0000 mg | ORAL_TABLET | Freq: Three times a day (TID) | ORAL | 0 refills | Status: AC | PRN

## 2022-12-19 NOTE — Progress Notes (Signed)
   Subjective: 1 Day Post-Op Procedure(s) (LRB): TOTAL KNEE ARTHROPLASTY (Right) Patient reports pain as mild.   Patient seen in rounds by Dr. Wynelle Link. Patient is well, and has had no acute complaints or problems  We will continue therapy today. Ambulated 21' with PT yesterday.  Objective: Vital signs in last 24 hours: Temp:  [97.6 F (36.4 C)-98.5 F (36.9 C)] 98.5 F (36.9 C) (01/30 6333) Pulse Rate:  [52-96] 76 (01/30 0608) Resp:  [12-23] 17 (01/30 0608) BP: (118-156)/(71-100) 143/88 (01/30 0608) SpO2:  [96 %-100 %] 100 % (01/30 0608)  Intake/Output from previous day:  Intake/Output Summary (Last 24 hours) at 12/19/2022 0746 Last data filed at 12/19/2022 0600 Gross per 24 hour  Intake 4394.92 ml  Output 3500 ml  Net 894.92 ml     Intake/Output this shift: No intake/output data recorded.  Labs: Recent Labs    12/19/22 0331  HGB 10.4*   Recent Labs    12/19/22 0331  WBC 9.3  RBC 3.39*  HCT 29.7*  PLT 156   Recent Labs    12/19/22 0331  NA 133*  K 4.1  CL 102  CO2 22  BUN 18  CREATININE 1.15  GLUCOSE 198*  CALCIUM 8.4*   No results for input(s): "LABPT", "INR" in the last 72 hours.  Exam: General - Patient is Alert and Oriented Extremity - Neurovascular intact Dorsiflexion/Plantar flexion intact Dressing - dressing C/D/I Motor Function - intact, moving foot and toes well on exam.   Past Medical History:  Diagnosis Date   Arthritis    Cancer ([place])    thyroid cancer   Diabetes mellitus without complication ([place])    Heart murmur    (Aortic regurg and mitral valve regurg per ECHO in 2012)   Hypertension    Hypothyroidism     Assessment/Plan: 1 Day Post-Op Procedure(s) (LRB): TOTAL KNEE ARTHROPLASTY (Right) Principal Problem:   OA (osteoarthritis) of knee Active Problems:   Primary osteoarthritis of right knee  Estimated body mass index is 32.78 kg/m as calculated from the following:   Height as of this encounter: '5\' 9"'$  (1.753 m).    Weight as of this encounter: 100.7 kg.   Patient's anticipated LOS is less than 2 midnights, meeting these requirements: - Younger than 30 - Lives within 1 hour of care - Has a competent adult at home to recover with post-op recover - NO history of  - Chronic pain requiring opiods  - Coronary Artery Disease  - Heart failure  - Heart attack  - Stroke  - DVT/VTE  - Cardiac arrhythmia  - Respiratory Failure/COPD  - Renal failure  - Anemia  - Advanced Liver disease  DVT Prophylaxis - Xarelto Weight bearing as tolerated. Up with PT this morning. Plan for d/c home after morning PT session if meeting goals.  Plan is to go Home after hospital stay with OPPT.  Shearon Balo, PA-C Orthopedic Surgery 12/19/2022, 7:46 AM

## 2022-12-19 NOTE — Progress Notes (Signed)
Physical Therapy Treatment Patient Details Name: Todd Roy MRN: [id number] DOB: June 29, 1946 Today's Date: 12/19/2022   History of Present Illness Pt is 77 yo male s/p R TKA on 12/18/22.  Pt with hx including but not limited to HTN, arthritis, thyroid CA, DM, L TKA 7/23    PT Comments    Pt is POD # 1 and is progressing well.  He reports pain at 3-4/10 and was able to tolerate activity but with increased effort and time. He was able to ambulate 100' with RW and supervision and performed 3 steps with bil rails similar to home set up. He required cues and min A to perform sit to stand.  Pt with good quad activation but ROM is limited by pain. Pt does require increased effort for transfers but is safe and has assist at home.  Pt eager to return home and friends feel comfortable assisting at current level. Pt demonstrates safe gait & transfers in order to return home from PT perspective once discharged by MD.  While in hospital, will continue to benefit from PT for skilled therapy to advance mobility and exercises.      Recommendations for follow up therapy are one component of a multi-disciplinary discharge planning process, led by the attending physician.  Recommendations may be updated based on patient status, additional functional criteria and insurance authorization.  Follow Up Recommendations  Follow physician's recommendations for discharge plan and follow up therapies     Assistance Recommended at Discharge Frequent or constant Supervision/Assistance  Patient can return home with the following A little help with walking and/or transfers;A little help with bathing/dressing/bathroom;Assistance with cooking/housework;Help with stairs or ramp for entrance   Equipment Recommendations  None recommended by PT    Recommendations for Other Services       Precautions / Restrictions Precautions Precautions: Fall Required Braces or Orthoses: Knee Immobilizer - Right Knee Immobilizer -  Right: Discontinue once straight leg raise with < 10 degree lag Restrictions Weight Bearing Restrictions: No RLE Weight Bearing: Weight bearing as tolerated     Mobility  Bed Mobility Overal bed mobility: Needs Assistance Bed Mobility: Sit to Supine, Supine to Sit     Supine to sit: Supervision Sit to supine: Supervision   General bed mobility comments: increased time but no assist    Transfers Overall transfer level: Needs assistance Equipment used: Rolling walker (2 wheels) Transfers: Sit to/from Stand Sit to Stand: Min assist           General transfer comment: Preferred hands on RW and with increased time/effort to rise; PT stabilized RW - educated friends on stabilizing RW if pt pushing up on RW.  Performed x 3 during session.    Ambulation/Gait Ambulation/Gait assistance: Min guard, Supervision Gait Distance (Feet): 100 Feet Assistive device: Rolling walker (2 wheels) Gait Pattern/deviations: Step-to pattern, Decreased weight shift to right, Trunk flexed Gait velocity: decreased     General Gait Details: Cues for RW proximity with fatigue and upright posture as able   Stairs Stairs: Yes Stairs assistance: Min guard Stair Management: Two rails, Step to pattern, Forwards Number of Stairs: 3     Wheelchair Mobility    Modified Rankin (Stroke Patients Only)       Balance Overall balance assessment: Needs assistance Sitting-balance support: No upper extremity supported Sitting balance-Leahy Scale: Good     Standing balance support: Bilateral upper extremity supported Standing balance-Leahy Scale: Poor Standing balance comment: Requiring RW but stable with RW  Cognition Arousal/Alertness: Awake/alert Behavior During Therapy: WFL for tasks assessed/performed Overall Cognitive Status: Within Functional Limits for tasks assessed                                          Exercises Total Joint  Exercises Ankle Circles/Pumps: AROM, Both, 10 reps, Seated Quad Sets: AROM, Both, 10 reps, Seated Heel Slides: AAROM, Right, 5 reps, Supine (gait belt for AAROM, ROM to tolerance only, only 5 reps for pain control) Hip ABduction/ADduction: AAROM, Right, 5 reps, Supine (gait belt for AAROM, ROM to tolerance only, only 5 reps for pain control) Long Arc Quad: AROM, Right, 10 reps, Seated Knee Flexion: AROM, Right, 10 reps, Seated Goniometric ROM: R knee ~10 to 60 degrees    General Comments  Educated on safe ice use, no pivots, car transfers, resting with leg straight, and TED hose during day. Also, encouraged walking every 1-2 hours during day. Educated on HEP with focus on mobility the first weeks. Discussed doing exercises within pain control and if pain increasing could decreased ROM, reps, and stop exercises as needed. Encouraged to perform quad sets and ankle pumps frequently for blood flow and to promote full knee extension.  Pt's friends that are staying with him present throughout session and feel comfortable assisting pt at home.  No further questions from pt or friends.        Pertinent Vitals/Pain Pain Assessment Pain Assessment: 0-10 Pain Score: 4  Pain Location: R knee Pain Descriptors / Indicators: Discomfort Pain Intervention(s): Limited activity within patient's tolerance, Monitored during session, Premedicated before session (declined ice pack at end of session)    Home Living                          Prior Function            PT Goals (current goals can now be found in the care plan section) Progress towards PT goals: Progressing toward goals    Frequency    7X/week      PT Plan Current plan remains appropriate    Co-evaluation              AM-PAC PT "6 Clicks" Mobility   Outcome Measure  Help needed turning from your back to your side while in a flat bed without using bedrails?: A Little Help needed moving from lying on your back to  sitting on the side of a flat bed without using bedrails?: A Little Help needed moving to and from a bed to a chair (including a wheelchair)?: A Little Help needed standing up from a chair using your arms (e.g., wheelchair or bedside chair)?: A Little Help needed to walk in hospital room?: A Little Help needed climbing 3-5 steps with a railing? : A Little 6 Click Score: 18    End of Session Equipment Utilized During Treatment: Gait belt Activity Tolerance: Patient tolerated treatment well Patient left: with call bell/phone within reach;in bed;with family/visitor present Nurse Communication: Mobility status PT Visit Diagnosis: Other abnormalities of gait and mobility (R26.89);Muscle weakness (generalized) (M62.81)     Time: 1610-9604 PT Time Calculation (min) (ACUTE ONLY): 29 min  Charges:  $Gait Training: 8-22 mins $Therapeutic Exercise: 8-22 mins                     Abran Richard, PT Acute Rehab Services [place]  [facility] [phone]    Karlton Lemon 12/19/2022, 11:29 AM

## 2022-12-19 NOTE — Plan of Care (Signed)

## 2022-12-19 NOTE — TOC Transition Note (Signed)
Transition of Care [facility]) - CM/SW Discharge Note   Patient Details  Name: Todd Roy MRN: [id number] Date of Birth: June 22, 1946  [facility] of [facility]) CM/SW Contact:  Lennart Pall, LCSW Phone Number: 12/19/2022, 12:39 PM   Clinical Narrative:     Met with pt briefly and confirming he has needed DME at home.  OPPT already arranged with [facility].  No TOC needs.  Final next level of care: OP Rehab Barriers to Discharge: No Barriers Identified   Patient Goals and CMS Choice      Discharge Placement                         Discharge Plan and Services Additional resources added to the After Visit Summary for                  DME Arranged: N/A DME Agency: NA                  Social Determinants of Health (SDOH) Interventions SDOH Screenings   Food Insecurity: No Food Insecurity (12/18/2022)  Housing: Low Risk  (12/18/2022)  Transportation Needs: No Transportation Needs (12/18/2022)  Utilities: Not At Risk (12/18/2022)  Tobacco Use: High Risk (12/19/2022)     Readmission Risk Interventions     No data to display

## 2022-12-27 NOTE — Discharge Summary (Signed)
Physician Discharge Summary   Patient ID: Todd Roy MRN: [id number] DOB/AGE: 1946-09-25 77 y.o.  Admit date: 12/18/2022 Discharge date: 12/19/2022  Primary Diagnosis: Right knee osteoarthritis   Admission Diagnoses:  Past Medical History:  Diagnosis Date   Arthritis    Cancer [facility])    thyroid cancer   Diabetes mellitus without complication ([facility])    Heart murmur    (Aortic regurg and mitral valve regurg per ECHO in 2012)   Hypertension    Hypothyroidism    Discharge Diagnoses:   Principal Problem:   OA (osteoarthritis) of knee Active Problems:   Primary osteoarthritis of right knee  Estimated body mass index is 32.78 kg/m as calculated from the following:   Height as of this encounter: '5\' 9"'$  (1.753 m).   Weight as of this encounter: 100.7 kg.  Procedure:  Procedure(s) (LRB): TOTAL KNEE ARTHROPLASTY (Right)   Consults: None  HPI: Todd Roy is a 77 y.o. year old male with end stage OA of his right knee with progressively worsening pain and dysfunction. He has constant pain, with activity and at rest and significant functional deficits with difficulties even with ADLs. He has had extensive non-op management including analgesics, injections of cortisone and viscosupplements, and home exercise program, but remains in significant pain with significant dysfunction. Radiographs show bone on bone arthritis medial and patellofemoral. He presents now for right Total Knee Arthroplasty.      Laboratory Data: Admission on 12/18/2022, Discharged on 12/19/2022  Component Date Value Ref Range Status   Glucose-Capillary 12/18/2022 141 (H)  70 - 99 mg/dL Final   Glucose reference range applies only to samples taken after fasting for at least 8 hours.   Glucose-Capillary 12/18/2022 154 (H)  70 - 99 mg/dL Final   Glucose reference range applies only to samples taken after fasting for at least 8 hours.   Comment 1 12/18/2022 Document in Chart   Final   Hgb A1c MFr Bld 12/18/2022  6.3 (H)  4.8 - 5.6 % Final   Comment: (NOTE) Pre diabetes:          5.7%-6.4%  Diabetes:              >6.4%  Glycemic control for   <7.0% adults with diabetes    Mean Plasma Glucose 12/18/2022 134.11  mg/dL Final   Performed at [facility], [place] [address] [place], [place] [address]   Glucose-Capillary 12/18/2022 189 (H)  70 - 99 mg/dL Final   Glucose reference range applies only to samples taken after fasting for at least 8 hours.   Glucose-Capillary 12/18/2022 280 (H)  70 - 99 mg/dL Final   Glucose reference range applies only to samples taken after fasting for at least 8 hours.   WBC 12/19/2022 9.3  4.0 - 10.5 K/uL Final   RBC 12/19/2022 3.39 (L)  4.22 - 5.81 MIL/uL Final   Hemoglobin 12/19/2022 10.4 (L)  13.0 - 17.0 g/dL Final   HCT 12/19/2022 29.7 (L)  39.0 - 52.0 % Final   MCV 12/19/2022 87.6  80.0 - 100.0 fL Final   MCH 12/19/2022 30.7  26.0 - 34.0 pg Final   MCHC 12/19/2022 35.0  30.0 - 36.0 g/dL Final   RDW 12/19/2022 13.8  11.5 - 15.5 % Final   Platelets 12/19/2022 156  150 - 400 K/uL Final   nRBC 12/19/2022 0.0  0.0 - 0.2 % Final   Performed at [facility], [place] [address], [place], [place] [address]  Sodium 12/19/2022 133 (L)  135 - 145 mmol/L Final   Potassium 12/19/2022 4.1  3.5 - 5.1 mmol/L Final   Chloride 12/19/2022 102  98 - 111 mmol/L Final   CO2 12/19/2022 22  22 - 32 mmol/L Final   Glucose, Bld 12/19/2022 198 (H)  70 - 99 mg/dL Final   Glucose reference range applies only to samples taken after fasting for at least 8 hours.   BUN 12/19/2022 18  8 - 23 mg/dL Final   Creatinine, Ser 12/19/2022 1.15  0.61 - 1.24 mg/dL Final   Calcium 12/19/2022 8.4 (L)  8.9 - 10.3 mg/dL Final   GFR, Estimated 12/19/2022 >60  >60 mL/min Final   Comment: (NOTE) Calculated using the CKD-EPI Creatinine Equation (2021)    Anion gap 12/19/2022 9  5 - 15 Final   Performed at [facility], [place] [address] [place], [place] [address]    Glucose-Capillary 12/18/2022 167 (H)  70 - 99 mg/dL Final   Glucose reference range applies only to samples taken after fasting for at least 8 hours.   Glucose-Capillary 12/19/2022 164 (H)  70 - 99 mg/dL Final   Glucose reference range applies only to samples taken after fasting for at least 8 hours.   Glucose-Capillary 12/19/2022 164 (H)  70 - 99 mg/dL Final   Glucose reference range applies only to samples taken after fasting for at least 8 hours.  Hospital Outpatient Visit on 12/08/2022  Component Date Value Ref Range Status   MRSA, PCR 12/08/2022 NEGATIVE  NEGATIVE Final   Staphylococcus aureus 12/08/2022 NEGATIVE  NEGATIVE Final   Comment: (NOTE) The Xpert SA Assay (FDA approved for NASAL specimens in patients 24 years of age and older), is one component of a comprehensive surveillance program. It is not intended to diagnose infection nor to guide or monitor treatment. Performed at [facility], [place] [address] [place], [place] [address]    Sodium 12/08/2022 137  135 - 145 mmol/L Final   Potassium 12/08/2022 4.1  3.5 - 5.1 mmol/L Final   Chloride 12/08/2022 105  98 - 111 mmol/L Final   CO2 12/08/2022 24  22 - 32 mmol/L Final   Glucose, Bld 12/08/2022 130 (H)  70 - 99 mg/dL Final   Glucose reference range applies only to samples taken after fasting for at least 8 hours.   BUN 12/08/2022 23  8 - 23 mg/dL Final   Creatinine, Ser 12/08/2022 1.18  0.61 - 1.24 mg/dL Final   Calcium 12/08/2022 9.3  8.9 - 10.3 mg/dL Final   GFR, Estimated 12/08/2022 >60  >60 mL/min Final   Comment: (NOTE) Calculated using the CKD-EPI Creatinine Equation (2021)    Anion gap 12/08/2022 8  5 - 15 Final   Performed at [facility], [place] [address], [place], [place] [address]   WBC 12/08/2022 6.0  4.0 - 10.5 K/uL Final   RBC 12/08/2022 4.86  4.22 - 5.81 MIL/uL Final   Hemoglobin 12/08/2022 14.6  13.0 - 17.0 g/dL Final   HCT 12/08/2022 43.1  39.0 - 52.0 % Final   MCV  12/08/2022 88.7  80.0 - 100.0 fL Final   MCH 12/08/2022 30.0  26.0 - 34.0 pg Final   MCHC 12/08/2022 33.9  30.0 - 36.0 g/dL Final   RDW 12/08/2022 14.3  11.5 - 15.5 % Final   Platelets 12/08/2022 210  150 - 400 K/uL Final   nRBC 12/08/2022 0.0  0.0 - 0.2 % Final  Performed at [facility], [place] [address], [place],  [address]   Glucose-Capillary 12/08/2022 139 (H)  70 - 99 mg/dL Final   Glucose reference range applies only to samples taken after fasting for at least 8 hours.     X-Rays:No results found.  EKG: Orders placed or performed during the hospital encounter of 05/09/22   EKG 12 lead per protocol   EKG 12 lead per protocol     Hospital Course: ROXANNE ORNER is a 77 y.o. who was admitted to [facility]. They were brought to the operating room on 12/18/2022 and underwent Procedure(s): TOTAL KNEE ARTHROPLASTY.  Patient tolerated the procedure well and was later transferred to the recovery room and then to the orthopaedic floor for postoperative care. They were given PO and IV analgesics for pain control following their surgery. They were given 24 hours of postoperative antibiotics of  Anti-infectives (From admission, onward)    Start     Dose/Rate Route Frequency Ordered Stop   12/19/22 1000  minocycline (MINOCIN) capsule 50 mg  Status:  Discontinued        50 mg Oral Daily 12/18/22 1213 12/19/22 1837   12/18/22 1500  ceFAZolin (ANCEF) IVPB 2g/100 mL premix        2 g 200 mL/hr over 30 Minutes Intravenous Every 6 hours 12/18/22 1213 12/18/22 2130   12/18/22 0730  ceFAZolin (ANCEF) IVPB 2g/100 mL premix        2 g 200 mL/hr over 30 Minutes Intravenous On call to O.R. 12/18/22 0720 12/18/22 0958      and started on DVT prophylaxis in the form of Xarelto.   PT and OT were ordered for total joint protocol. Discharge planning consulted to help with postop disposition and equipment needs.  Patient had an uneventful night on the evening of surgery.  They started to get up OOB with therapy on POD 0. Pt was seen during rounds and was ready to go home pending progress with therapy. He worked with therapy on POD #1 and was meeting his goals. Pt was discharged to home later that day in stable condition.  Diet: Diabetic diet Activity: WBAT Follow-up: in 2 weeks Disposition: Home Discharged Condition: good   Discharge Instructions     Call MD / Call 911   Complete by: As directed    If you experience chest pain or shortness of breath, CALL 911 and be transported to the hospital emergency room.  If you develope a fever above 101 F, pus (white drainage) or increased drainage or redness at the wound, or calf pain, call your surgeon's office.   Change dressing   Complete by: As directed    You may remove the bulky bandage (ACE wrap and gauze) two days after surgery. You will have an adhesive waterproof bandage underneath. Leave this in place until your first follow-up appointment.   Constipation Prevention   Complete by: As directed    Drink plenty of fluids.  Prune juice may be helpful.  You may use a stool softener, such as Colace (over the counter) 100 mg twice a day.  Use MiraLax (over the counter) for constipation as needed.   Diet - low sodium heart healthy   Complete by: As directed    Do not put a pillow under the knee. Place it under the heel.   Complete by: As directed    Driving restrictions   Complete by: As directed    No driving for two weeks  Post-operative opioid taper instructions:   Complete by: As directed    POST-OPERATIVE OPIOID TAPER INSTRUCTIONS: It is important to wean off of your opioid medication as soon as possible. If you do not need pain medication after your surgery it is ok to stop day one. Opioids include: Codeine, Hydrocodone(Norco, Vicodin), Oxycodone(Percocet, oxycontin) and hydromorphone amongst others.  Long term and even short term use of opiods can cause: Increased pain  response Dependence Constipation Depression Respiratory depression And more.  Withdrawal symptoms can include Flu like symptoms Nausea, vomiting And more Techniques to manage these symptoms Hydrate well Eat regular healthy meals Stay active Use relaxation techniques(deep breathing, meditating, yoga) Do Not substitute Alcohol to help with tapering If you have been on opioids for less than two weeks and do not have pain than it is ok to stop all together.  Plan to wean off of opioids This plan should start within one week post op of your joint replacement. Maintain the same interval or time between taking each dose and first decrease the dose.  Cut the total daily intake of opioids by one tablet each day Next start to increase the time between doses. The last dose that should be eliminated is the evening dose.      TED hose   Complete by: As directed    Use stockings (TED hose) for three weeks on both leg(s).  You may remove them at night for sleeping.   Weight bearing as tolerated   Complete by: As directed       Allergies as of 12/19/2022       Reactions   Sulfonamide Derivatives    Unknown Reaction was told as a child        Medication List     TAKE these medications    acetaminophen 500 MG tablet Commonly known as: TYLENOL Take 2 tablets (1,000 mg total) by mouth every 8 (eight) hours as needed.   hydrochlorothiazide 12.5 MG tablet Commonly known as: HYDRODIURIL Take 12.5 mg by mouth daily.   losartan 100 MG tablet Commonly known as: COZAAR Take 100 mg by mouth daily.   metFORMIN 500 MG tablet Commonly known as: GLUCOPHAGE Take 500 mg by mouth 2 (two) times daily with a meal.   methocarbamol 500 MG tablet Commonly known as: ROBAXIN Take 1 tablet (500 mg total) by mouth every 6 (six) hours as needed for muscle spasms. What changed: Another medication with the same name was added. Make sure you understand how and when to take each.   methocarbamol  500 MG tablet Commonly known as: ROBAXIN Take 1 tablet (500 mg total) by mouth every 6 (six) hours as needed for muscle spasms. What changed: You were already taking a medication with the same name, and this prescription was added. Make sure you understand how and when to take each.   minocycline 50 MG tablet Commonly known as: DYNACIN Take 50 mg by mouth daily.   oxyCODONE 5 MG immediate release tablet Commonly known as: Oxy IR/ROXICODONE Take 1-2 tablets (5-10 mg total) by mouth every 6 (six) hours as needed for severe pain.   rivaroxaban 10 MG Tabs tablet Commonly known as: XARELTO Take 1 tablet (10 mg total) by mouth daily with breakfast.   rosuvastatin 20 MG tablet Commonly known as: CRESTOR Take 20 mg by mouth daily.   Synthroid 200 MCG tablet Generic drug: levothyroxine Take 200 mcg by mouth daily before breakfast.   tadalafil 10 MG tablet Commonly known as: CIALIS Take 10  mg by mouth daily.   traMADol 50 MG tablet Commonly known as: ULTRAM Take 1-2 tablets (50-100 mg total) by mouth every 6 (six) hours as needed for moderate pain.               Discharge Care Instructions  (From admission, onward)           Start     Ordered   12/19/22 0000  Weight bearing as tolerated        12/19/22 1257   12/19/22 0000  Change dressing       Comments: You may remove the bulky bandage (ACE wrap and gauze) two days after surgery. You will have an adhesive waterproof bandage underneath. Leave this in place until your first follow-up appointment.   12/19/22 1257            Follow-up Information     Gaynelle Arabian, MD Follow up in 2 week(s).   Specialty: Orthopedic Surgery Contact information: [address] [place] [address] [phone]                 Signed: Shearon Balo, PA-C Orthopedic Surgery 12/27/2022, 11:16 AM
